# Patient Record
Sex: Female | Born: 1970 | Race: White | Hispanic: No | Marital: Married | Smoking: Current every day smoker
Health system: Southern US, Community
[De-identification: ages and names within clinical notes are randomized; demographics above are authoritative.]

## PROBLEM LIST (undated history)

## (undated) ENCOUNTER — Ambulatory Visit: Payer: PRIVATE HEALTH INSURANCE | Attending: Anesthesiology | Primary: Anesthesiology

## (undated) ENCOUNTER — Telehealth

## (undated) ENCOUNTER — Encounter

## (undated) ENCOUNTER — Ambulatory Visit

## (undated) ENCOUNTER — Encounter: Attending: Family Medicine | Primary: Family Medicine

## (undated) ENCOUNTER — Ambulatory Visit: Payer: PRIVATE HEALTH INSURANCE

## (undated) ENCOUNTER — Telehealth: Attending: Family | Primary: Family

## (undated) ENCOUNTER — Telehealth
Attending: Pharmacist Clinician (PhC)/ Clinical Pharmacy Specialist | Primary: Pharmacist Clinician (PhC)/ Clinical Pharmacy Specialist

## (undated) ENCOUNTER — Ambulatory Visit: Payer: Medicaid (Managed Care)

## (undated) ENCOUNTER — Encounter: Attending: Anesthesiology | Primary: Anesthesiology

## (undated) ENCOUNTER — Non-Acute Institutional Stay: Payer: PRIVATE HEALTH INSURANCE | Attending: Family Medicine | Primary: Family Medicine

## (undated) ENCOUNTER — Ambulatory Visit
Payer: Medicaid (Managed Care) | Attending: Student in an Organized Health Care Education/Training Program | Primary: Student in an Organized Health Care Education/Training Program

## (undated) ENCOUNTER — Encounter: Attending: Family | Primary: Family

## (undated) ENCOUNTER — Ambulatory Visit: Payer: PRIVATE HEALTH INSURANCE | Attending: Podiatrist | Primary: Podiatrist

## (undated) ENCOUNTER — Encounter: Payer: PRIVATE HEALTH INSURANCE | Attending: Family Medicine | Primary: Family Medicine

## (undated) ENCOUNTER — Encounter: Payer: PRIVATE HEALTH INSURANCE | Attending: Obstetrics & Gynecology | Primary: Obstetrics & Gynecology

## (undated) ENCOUNTER — Telehealth
Attending: Student in an Organized Health Care Education/Training Program | Primary: Student in an Organized Health Care Education/Training Program

## (undated) ENCOUNTER — Telehealth: Attending: Family Medicine | Primary: Family Medicine

## (undated) ENCOUNTER — Inpatient Hospital Stay

## (undated) ENCOUNTER — Telehealth: Attending: Internal Medicine | Primary: Internal Medicine

## (undated) ENCOUNTER — Encounter: Payer: PRIVATE HEALTH INSURANCE | Attending: Family | Primary: Family

## (undated) ENCOUNTER — Ambulatory Visit: Payer: PRIVATE HEALTH INSURANCE | Attending: Retina Specialist | Primary: Retina Specialist

## (undated) ENCOUNTER — Ambulatory Visit: Payer: Medicaid (Managed Care) | Attending: Orthopaedic Surgery | Primary: Orthopaedic Surgery

## (undated) ENCOUNTER — Telehealth: Attending: Clinical | Primary: Clinical

## (undated) ENCOUNTER — Encounter: Payer: PRIVATE HEALTH INSURANCE | Attending: Podiatrist | Primary: Podiatrist

## (undated) ENCOUNTER — Encounter: Attending: Internal Medicine | Primary: Internal Medicine

## (undated) ENCOUNTER — Encounter
Attending: Student in an Organized Health Care Education/Training Program | Primary: Student in an Organized Health Care Education/Training Program

## (undated) ENCOUNTER — Ambulatory Visit: Payer: Medicaid (Managed Care) | Attending: Family | Primary: Family

## (undated) ENCOUNTER — Encounter
Attending: Pharmacist Clinician (PhC)/ Clinical Pharmacy Specialist | Primary: Pharmacist Clinician (PhC)/ Clinical Pharmacy Specialist

## (undated) ENCOUNTER — Ambulatory Visit: Payer: PRIVATE HEALTH INSURANCE | Attending: Family Medicine | Primary: Family Medicine

## (undated) ENCOUNTER — Ambulatory Visit
Attending: Pharmacist Clinician (PhC)/ Clinical Pharmacy Specialist | Primary: Pharmacist Clinician (PhC)/ Clinical Pharmacy Specialist

## (undated) ENCOUNTER — Encounter: Attending: Diagnostic Radiology | Primary: Diagnostic Radiology

## (undated) DIAGNOSIS — E119 Type 2 diabetes mellitus without complications: Secondary | ICD-10-CM

## (undated) DIAGNOSIS — K219 Gastro-esophageal reflux disease without esophagitis: Secondary | ICD-10-CM

## (undated) DIAGNOSIS — K3184 Gastroparesis: Secondary | ICD-10-CM

## (undated) DIAGNOSIS — J449 Chronic obstructive pulmonary disease, unspecified: Secondary | ICD-10-CM

## (undated) DIAGNOSIS — I10 Essential (primary) hypertension: Secondary | ICD-10-CM

## (undated) DIAGNOSIS — B1920 Unspecified viral hepatitis C without hepatic coma: Secondary | ICD-10-CM

## (undated) DIAGNOSIS — K7460 Unspecified cirrhosis of liver: Secondary | ICD-10-CM

## (undated) DIAGNOSIS — D649 Anemia, unspecified: Secondary | ICD-10-CM

## (undated) HISTORY — PX: ABDOMINAL HYSTERECTOMY: SHX81

## (undated) HISTORY — PX: APPENDECTOMY: SHX54

## (undated) HISTORY — PX: KNEE REPAIR EXTENSOR MECHANISM: SHX6613

## (undated) HISTORY — PX: EYE SURGERY: SHX253

---
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
## 2016-10-19 DIAGNOSIS — B1920 Unspecified viral hepatitis C without hepatic coma: Secondary | ICD-10-CM

## 2016-10-19 DIAGNOSIS — J189 Pneumonia, unspecified organism: Secondary | ICD-10-CM

## 2016-10-19 DIAGNOSIS — D72829 Elevated white blood cell count, unspecified: Secondary | ICD-10-CM

## 2016-10-19 DIAGNOSIS — I10 Essential (primary) hypertension: Secondary | ICD-10-CM | POA: Diagnosis not present

## 2016-10-19 DIAGNOSIS — A419 Sepsis, unspecified organism: Secondary | ICD-10-CM

## 2016-10-20 DIAGNOSIS — I10 Essential (primary) hypertension: Secondary | ICD-10-CM | POA: Diagnosis not present

## 2016-10-20 DIAGNOSIS — J189 Pneumonia, unspecified organism: Secondary | ICD-10-CM | POA: Diagnosis not present

## 2016-10-20 DIAGNOSIS — D72829 Elevated white blood cell count, unspecified: Secondary | ICD-10-CM | POA: Diagnosis not present

## 2016-10-20 DIAGNOSIS — B1920 Unspecified viral hepatitis C without hepatic coma: Secondary | ICD-10-CM | POA: Diagnosis not present

## 2016-10-21 DIAGNOSIS — I10 Essential (primary) hypertension: Secondary | ICD-10-CM | POA: Diagnosis not present

## 2016-10-21 DIAGNOSIS — D72829 Elevated white blood cell count, unspecified: Secondary | ICD-10-CM | POA: Diagnosis not present

## 2016-10-21 DIAGNOSIS — J189 Pneumonia, unspecified organism: Secondary | ICD-10-CM | POA: Diagnosis not present

## 2016-10-21 DIAGNOSIS — B1920 Unspecified viral hepatitis C without hepatic coma: Secondary | ICD-10-CM | POA: Diagnosis not present

## 2017-04-20 ENCOUNTER — Ambulatory Visit
Admission: RE | Admit: 2017-04-20 | Discharge: 2017-04-20 | Disposition: A | Discharge status: Home / Self Care | Payer: Commercial Managed Care - PPO

## 2017-04-20 DIAGNOSIS — B182 Chronic viral hepatitis C: Secondary | ICD-10-CM

## 2017-04-20 DIAGNOSIS — J449 Chronic obstructive pulmonary disease, unspecified: Secondary | ICD-10-CM

## 2017-04-20 DIAGNOSIS — E118 Type 2 diabetes mellitus with unspecified complications: Principal | ICD-10-CM

## 2017-04-20 DIAGNOSIS — R609 Edema, unspecified: Secondary | ICD-10-CM

## 2017-04-20 DIAGNOSIS — Z1239 Encounter for other screening for malignant neoplasm of breast: Secondary | ICD-10-CM

## 2017-05-11 ENCOUNTER — Ambulatory Visit
Admission: RE | Admit: 2017-05-11 | Discharge: 2017-05-11 | Disposition: A | Discharge status: Home / Self Care | Payer: Commercial Managed Care - PPO

## 2017-05-11 DIAGNOSIS — Z1159 Encounter for screening for other viral diseases: Principal | ICD-10-CM

## 2017-06-12 ENCOUNTER — Ambulatory Visit
Admission: RE | Admit: 2017-06-12 | Discharge: 2017-06-12 | Disposition: A | Discharge status: Home / Self Care | Payer: Commercial Managed Care - PPO | Attending: Gastroenterology | Admitting: Gastroenterology

## 2017-06-12 DIAGNOSIS — B1920 Unspecified viral hepatitis C without hepatic coma: Principal | ICD-10-CM

## 2017-06-12 DIAGNOSIS — R1010 Upper abdominal pain, unspecified: Secondary | ICD-10-CM

## 2017-06-12 NOTE — Unmapped (Signed)
Counseling for HCV treatment     B18.2 Hep C: yes    K74.60 Cirrhosis: yes,   Child Pugh Score if applicable and for Medicaid pts: 5  Z94.4 Liver Transplant: yes    Genotype: 1a  (Ref 10/27/16 pg 6),  HCV RNA:  71,316 IU/ml on 05/11/17  Fibrosis score: F4 (60.1kPa) on Fibroscan on 06/12/17  HIV Co-infection? no  Signs of liver decompensation? no  Previous treatment? no    Planned regimen: Harvoni (ledipasvir/sofosbuvir 90/400mg ) x 12 weeks  Urgency: Routine Request    Prescribing Provider/NPI: Dr. Gavin Potters / [id number]  Signature waiver form not obtained at this time.  Insurance: [employer] Tyrica Afzal is 46 y.o. Caucasian female who presents to clinic with daughter, Karen Kitchens and is interested in starting treatment with Harvoni. We discussed the prior authorization (PA) process of obtaining the medication through insurance and that this may take some time.   We also discussed the importance of continued cirrhosis care and importance of ongoing surveillance for [facility].      Current medications:  Current Outpatient Prescriptions   Medication Sig Dispense Refill   ??? blood sugar diagnostic (GLUCOSE BLOOD) Strp Check blood sugar before breakfast and as needed. 100 each 11   ??? blood-glucose meter kit Use as instructed 1 each 0   ??? ibuprofen (ADVIL,MOTRIN) 600 MG tablet Take 1 tablet by mouth Two (2) times a day.     ??? lancets Misc Check blood sugar before breakfast and as needed. 100 each 11   ??? lisinopril (PRINIVIL,ZESTRIL) 40 MG tablet Take 1 tablet (40 mg total) by mouth daily. For blood pressure. 90 tablet 4   ??? metFORMIN (GLUCOPHAGE) 1000 MG tablet Take 1 tablet (1,000 mg total) by mouth 2 (two) times a day with meals. For diabetes. (Patient taking differently: Take 1,000 mg by mouth daily with breakfast. For diabetes.) 180 tablet 4   ??? ondansetron (ZOFRAN) 4 MG tablet TAKE 1 TABLET(4 MG) BY MOUTH DAILY AS NEEDED FOR NAUSEA 10 tablet 0   ??? tiotropium (SPIRIVA) 18 mcg inhalation capsule Place 1 capsule (18 mcg total) into inhaler and inhale daily. 30 capsule 11   OTC: ibuprofen prn     Following topics were discussed during counseling:    1. Indications for medication, dosage and administration.     A. Harvoni 90/400mg  1 tablet to take daily with or without food.    2. Common side effects of medications and management strategies. (fatigue, headache)    3. Importance of adherence to regimen, follow-up clinic visits and lab monitoring.     A. Asked patient to call [facility] [phone]  to establish start date for treatment and to schedule appointment 4 weeks before starting treatment.    4. Drug-drug interaction.    A. Current medications have been reviewed and assessed for possible interaction.     - Lovastatin: Currently not taking. Reports having adverse effects (i.e. Vomiting) with lovastatin thus discontinued. Recommended to discuss with PCP and to inform us if starting any new statin due to DDI with many statins.     -Denies any antacid/heartburn medication.  We discussed the mechanism of drug-drug interaction with acid lowering agents.     B. Advised to check with MD or pharmacist before taking any OTC/herbal medications, with emphasis regarding indigestion/heartburn medications.  Denies use of herbal medication such as milk thistle or St. John's wart.  Allergies have been verified. Denies alcohol. Recommended to stop marijuana.  5. Importance of informing pharmacy and clinic of updated contact information.   Discussed the process of obtaining medication through specialty pharmacy and when approved medication will be delivered to patient's home.     Patient verbalized understanding. Provided contact information for any questions/concerns.       Park Breed, Pharm D., BCPS, CGP, CPP  [facility]  [address]  [place], [place] [address]  [phone]    This portion of the visit was 20 minutes in duration and greater than 50% was spend in direct counseling and coordination of care regarding hepatitis C medication management.

## 2017-06-12 NOTE — Unmapped (Signed)
[facility]    Alba Destine, M.D.  Professor of Medicine  Director, [facility]  [place] at [place]    [phone]    Laurie Panda, MD  [address]  [address]  [facility]  [place], [place] [address]     Chief complaint: Patient is referred for consultation for chronic hepatitis C genotype 1A    Present illness: Patient is a 46 y.o. Caucasian female with chronic hepatitis C genotype 1A. The patient was first diagnosed around February 2018 when she was hospitalized for urosepsis. Patient states that she may have had acute hepatitis in 2017 and [facility]. She does not recall being told what kind of hepatitis that was and I do not have results available from that hospitalization this time. Since being discharged from the hospital she notes fatigue and abdominal bloating. She has constant upper abdominal pain. She also has some nausea and vomiting. She notes shortness of breath with a recent diagnosis of COPD. She eats small portions of food to minimize abdominal pain and nausea.  No Gi bleeding.     10 system review of systems is as noted above.    Past medical history:  1. COPD  2. Diabetic neuropathy  3. Chronic back pain  4. Hypertension  5. No history of coronary artery disease  6. Immune to HAV, NEEDS HBV VACCINE NEXT VISIT  7. FIBROSCAN 05/2017:60 kPa consistent with stage F4 cirrhosis    Allergies   Allergen Reactions   ??? Amitriptyline Other (See Comments)     Hallucinations    ??? Tylenol [Acetaminophen] Itching       Current Outpatient Prescriptions   Medication Sig Dispense Refill   ??? blood sugar diagnostic (GLUCOSE BLOOD) Strp Check blood sugar before breakfast and as needed. 100 each 11   ??? blood-glucose meter kit Use as instructed 1 each 0   ??? ibuprofen (ADVIL,MOTRIN) 600 MG tablet Take 1 tablet by mouth Two (2) times a day.     ??? lancets Misc Check blood sugar before breakfast and as needed. 100 each 11   ??? lisinopril (PRINIVIL,ZESTRIL) 40 MG tablet Take 1 tablet (40 mg total) by mouth daily. For blood pressure. 90 tablet 4   ??? metFORMIN (GLUCOPHAGE) 1000 MG tablet Take 1 tablet (1,000 mg total) by mouth 2 (two) times a day with meals. For diabetes. (Patient taking differently: Take 1,000 mg by mouth daily with breakfast. For diabetes.) 180 tablet 4   ??? ondansetron (ZOFRAN) 4 MG tablet TAKE 1 TABLET(4 MG) BY MOUTH DAILY AS NEEDED FOR NAUSEA 10 tablet 0   ??? tiotropium (SPIRIVA) 18 mcg inhalation capsule Place 1 capsule (18 mcg total) into inhaler and inhale daily. 30 capsule 11   ??? lovastatin (MEVACOR) 20 MG tablet TAKE 1 TABLET BY MOUTH EVERY DAY WITH EVENING MEAL (Patient not taking: Reported on 06/12/2017) 90 tablet 0   ??? traMADol (ULTRAM) 50 mg tablet Take 1 tablet by mouth daily as needed.       No current facility-administered medications for this visit.      Social history: The patient is not working. She is married. She has 3 children. Her daughter has been tested for hepatitis C and is negative. She does not drink alcohol. She smokes 1 pack of cigarettes per day.    Family history: Mother has alcoholic cirrhosis.    BP 129/88  - Pulse 80  -  Temp 36.9 ??C (98.4 ??F)  - Resp 20  - Ht 165.1 cm (5' 5)  - Wt 84.8 kg (187 lb)  - SpO2 99%  - BMI 31.12 kg/m??     Pleasant individual in NAD    HEENT: Sclera are anicteric, no temporal muscle loss, oropharynx is negative  NECK: No thyromegaly or lymphadenopathy, No carotid bruits  Chest: Clear to auscultation and percussion  Heart: S1, S2, RR, No murmurs  Abdomen: Soft, non-tender, non-distended, +hepatomegaly, no splenomegaly, no masses appreciated, no ascites  Skin: +spider angiomata, No rashes  Extremities: + 1 pedal edema, +palmar erythema  Neuro: Grossly intact, No focal deficits      Results for orders placed or performed in visit on 06/12/17   Comprehensive Metabolic Panel   Result Value Ref Range    Sodium 142 135 - 145 mmol/L    Potassium 3.9 3.5 - 5.0 mmol/L Chloride 105 98 - 107 mmol/L    CO2 27.0 22.0 - 30.0 mmol/L    BUN 7 7 - 21 mg/dL    Creatinine 1.30 (L) 0.60 - 1.00 mg/dL    BUN/Creatinine Ratio 13     EGFR MDRD Non Af Amer >=60 >=60 mL/min/1.15m2    EGFR MDRD Af Amer >=60 >=60 mL/min/1.82m2    Anion Gap 10 9 - 15 mmol/L    Glucose 73 65 - 179 mg/dL    Calcium 8.7 8.5 - 86.5 mg/dL    Albumin 3.6 3.5 - 5.0 g/dL    Total Protein 8.6 (H) 6.5 - 8.3 g/dL    Total Bilirubin 1.3 (H) 0.0 - 1.2 mg/dL    AST 784 (H) 14 - 38 U/L    ALT 93 (H) 15 - 48 U/L    Alkaline Phosphatase 94 38 - 126 U/L   PT-INR   Result Value Ref Range    PT 14.0 (H) 10.2 - 12.8 sec    INR 1.23    Iron Level and TIBC   Result Value Ref Range    Iron 181 (H) 35 - 165 ug/dL    TIBC 696.2 952.8 - 413.2 mg/dL    Transferrin 440.1 027.2 - 380.0 mg/dL    Iron Saturation (%) 61 (H) 15 - 50 %   Ferritin   Result Value Ref Range    Ferritin 233.0 (H) 3.0 - 151.0 ng/mL   AFP tumor marker   Result Value Ref Range    AFP-Tumor Marker 27.70 (H) <7.51 ng/mL   Hepatitis B Surface Antigen   Result Value Ref Range    Hepatitis B Surface Ag Nonreactive Nonreactive   Hepatitis B Core Antibody, total   Result Value Ref Range    Hep B Core Total Ab Nonreactive Nonreactive   Hepatitis B Surface Antibody   Result Value Ref Range    Hep B S Ab Nonreactive Nonreactive, Grayzone    Hepatitis B Surface Ab Quant <8.00 <8.00 m(IU)/mL   Hepatitis A IgG   Result Value Ref Range    Hepatitis A IgG Reactive (A) Nonreactive   CBC w/ Differential   Result Value Ref Range    WBC 4.9 4.5 - 11.0 10*9/L    RBC 3.81 (L) 4.00 - 5.20 10*12/L    HGB 12.9 12.0 - 16.0 g/dL    HCT 53.6 64.4 - 03.4 %    MCV 105.2 (H) 80.0 - 100.0 fL    MCH 33.8 26.0 - 34.0 pg    MCHC 32.1 31.0 - 37.0 g/dL    RDW 74.2 (  H) 12.0 - 15.0 %    MPV 11.4 (H) 7.0 - 10.0 fL    Platelet 105 (L) 150 - 440 10*9/L    Absolute Neutrophils 2.6 2.0 - 7.5 10*9/L    Absolute Lymphocytes 1.7 1.5 - 5.0 10*9/L    Absolute Monocytes 0.3 0.2 - 0.8 10*9/L    Absolute Eosinophils 0.2 0.0 - 0.4 10*9/L    Absolute Basophils 0.0 0.0 - 0.1 10*9/L    Large Unstained Cells 2 0 - 4 %    Macrocytosis Marked (A) Not Present    Anisocytosis Slight (A) Not Present   Morphology Review   Result Value Ref Range    Smear Review Comments See Comment (A) Undefined     MELD-Na score: 9 at 06/12/2017 11:07 AM  MELD score: 9 at 06/12/2017 11:07 AM  Calculated from:  Serum Creatinine: 0.53 mg/dL (Rounded to 1) at 1/61/0960 11:07 AM  Serum Sodium: 142 mmol/L (Rounded to 137) at 06/12/2017 11:07 AM  Total Bilirubin: 1.3 mg/dL at 4/54/0981 19:14 AM  INR(ratio): 1.23 at 06/12/2017 11:07 AM  Age: 30 years      Impression:  1. Chronic hepatitis C genotype 1A with evidence of well compensated cirrhosis.  This patient appears to have well compensated cirrhosis based upon the data available to [place] thus far. We discussed the naturally history of cirrhosis including increased risk of liver failure, hepatic decompensation, liver cancer and need for liver transplantation. At this point, patient has a relatively low MELD and is not in need of liver transplant evaluation. We discussed that HCV can progress and ther fore continued monitoring is important. Patient should have EGD surveillance for esophageal varices if not performed recently. We also discussed the importance of hepatic imaging performed approximately every 6 months to screen for the development of HCC.     The patient will meet with our pharmacist Erskine Squibb to review treatment options. She will be good candidate for treatment that has high rate of cure and low risk of side effects.    2. HCC surveillance: MRI and MRCP was ordered for further evaluation    3. Evidence of portal hypertension: Upper endoscopy was ordered to screen for esophageal varices    4. Chronic abdominal pain: We'll further evaluate with upper endoscopy and MRI MRCP as noted above.    5. Lower extremity edema: The patient does not have clinical ascites. I recommended a low-salt diet and his initial management for her lower extremity edema. If she remains adherent to the diet and continues to have lower extremity edema than would start low-dose spironolactone and furosemide.    6. We discussed the low risk of vertical transmission to her children: Her children should be tested for hepatitis C on 1 occasion. I reassured her about the absence of risk at this time.    The patient return for follow-up in 4 weeks to see Owens Shark DNP.    Alba Destine, M.D.  Professor of Medicine  Director, [facility]  [place] [facility] [place] at [place]    [phone]

## 2017-06-12 NOTE — Unmapped (Addendum)
[facility]  FAST ??? Fibrosis Assessment Team  Division of Gastroenterology and Hepatology  ??  ??    ??  FIBROSCAN will be performed to assess hepatic fibrosis (scarring) in order to stage this patient's liver disease. This will assist with evaluating the natural course of the disease and will provide important information regarding prognosis, duration of therapy, and potential response to treatment. This information will also help assess risk for hepatocellular carcinoma and need for liver cancer surveillance.??  ??  FibroscanProcedure:   After obtaining verbal consent, the patient was placed in a supine position. Physical characteristics and landmarks were assessed to establish appropriate mid-axillary intercostal space for probe placement. 50Hz  Shear Wave pulses were applied and the resulting Shear Wave and Propagation Speed detected with a 3.5 MHz ultrasonic signal, using the FibroScan probe.  Skin to liver capsule distance and liver parenchyma were accessed during the entire examination with the FibroScan probe. The patient was instructed to breathe normally and to abstain from sudden movements during the procedure, allowing for random measurements of liver stiffness. At least ten Shear Waves were produced; individual measurements of each Shear Wave were calculated. Patient tolerated the procedure well and was discharged without incident.  ??  Probe used []  M+    Serial # [id number]                         [x]  XL+   Serial # [id number]  ??  Main Etiology of Liver Disease:  [x] HCV     [] HBV   [] Alcohol    [] NASH  [] PBC      [] PSC     [] Other________________  ??  50Hz  shear wave pulses were applied and the resulting shear wave and propagation speed detected with a 3.5 MHz ultrasonic signal, using the FibroScan probe.     ??  At least ten Shear Waves were produced; individual measurements of each shear wave were calculated.    ??  Patient tolerated the procedure well and was discharged without incident.  ??  Fibroscan score: ___60.1___kPa  ??  IQR:                       ___22___%  ??  Test performed by: Luiz Ochoa, RN  ??  [facility]  FAST ??? Fibrosis Assessment Team  Division of Gastroenterology and Hepatology  ??  ??    ??  ??  ??  Estimation of the stage of liver fibrosis (Metavir Score):  The results of the Liver Stiffness Score are consistent with the following liver fibrosis stage:  ??  ??  []  F0-F1             []  F2               []  F3               []  F4  ??  ??  GENERAL RECOMMENDATIONS ACCORDING TO THE STAGE OF LIVER FIBROSIS.  ??  F0-F1: No-minimal fibrosis. The risk of progression to advanced fibrosis and cirrhosis is low. If the cause of liver disease is not removed, a 1-2 yr follow-up study is recommended.   F2: Significant fibrosis. There is a moderate risk of progression to cirrhosis. If the cause of liver disease is not removed, a follow-up study in 12 months is recommended.  F3: Advanced (pre-cirrhotic stage). The risk of progression to cirrhosis is high. Imaging studies to rule out hepatocellular carcinoma  should be considered. Efforts to remove the cause of liver disease are highly recommended.  F4: Cirrhosis. There is significant risk of portal hypertension and esophageal varices. An upper endoscopy is recommended. Imaging studies for hepatocellular carcinoma screening are recommended.   ??  Any and all FibroScan studies must be carefully evaluated, taking fully into account all individual measurement/scans, patient history and other factors.  As with liver biopsy, any estimation of liver fibrosis may be subject to under or over staging due to sampling error.  Any further medical or surgical intervention should be made only while fully considering the circumstances of this patient and in consultation with this patient.    I have reviewed and interpreted the FIBROSCAN test results as described above.  ??  Alba Destine, M.D.  Professor of Medicine  Director, [facility]  [place] at [place] [phone]

## 2017-06-13 LAB — HEPATITIS B SURFACE ANTIBODY QUANT: Hepatitis B virus surface Ab:ACnc:Pt:Ser:Qn:: <8

## 2017-06-13 LAB — HEPATITIS A IGG: Hepatitis A virus Ab.IgG:PrThr:Pt:Ser:Ord:: REACTIVE — AB

## 2017-06-13 LAB — HEPATITIS B CORE TOTAL ANTIBODY: Hepatitis B virus core Ab:PrThr:Pt:Ser/Plas:Ord:IA: NONREACTIVE

## 2017-06-13 LAB — HEPATITIS B SURFACE ANTIGEN: Hepatitis B virus surface Ag:PrThr:Pt:Ser:Ord:: NONREACTIVE

## 2017-06-13 LAB — HEPATITIS B SURFACE ANTIBODY: HEPATITIS B SURFACE ANTIBODY: NONREACTIVE

## 2017-06-19 ENCOUNTER — Ambulatory Visit
Admission: RE | Admit: 2017-06-19 | Discharge: 2017-06-19 | Disposition: A | Discharge status: Home / Self Care | Payer: Commercial Managed Care - PPO

## 2017-06-19 DIAGNOSIS — R1010 Upper abdominal pain, unspecified: Principal | ICD-10-CM

## 2017-06-26 NOTE — Unmapped (Signed)
I have reviewed and interpreted the FIBROSCAN test results as described above.    Alba Destine, M.D.  Professor of Medicine  Director, [facility]  [place] at [facility]

## 2017-06-27 NOTE — Unmapped (Signed)
Spoke with patient. Patient scheduled appt for 06/28/17 at 11:20 to discuss letter. Patient verbalized understanding.

## 2017-06-27 NOTE — Unmapped (Signed)
-----   Message from Danton Clap, MD sent at 06/27/2017 11:03 AM EDT -----  Would need to have OV to discuss  ----- Message -----  From: Gifford Shave, CMA  Sent: 06/26/2017   2:33 PM  To: Danton Clap, MD    Please advise.  ----- Message -----  From: Selinda Eon  Sent: 06/26/2017   1:52 PM  To: Gifford Shave, CMA    Patient requesting a letter for Social Services stating that heating and cooling are needed for health factors which would be her COPD

## 2017-06-28 ENCOUNTER — Ambulatory Visit: Admission: RE | Admit: 2017-06-28 | Discharge: 2017-06-28 | Payer: Commercial Managed Care - PPO

## 2017-06-28 DIAGNOSIS — L309 Dermatitis, unspecified: Principal | ICD-10-CM

## 2017-06-28 MED ORDER — CLOTRIMAZOLE 1 % TOPICAL CREAM
Freq: Two times a day (BID) | TOPICAL | 0 refills | 0 days | Status: CP
Start: 2017-06-28 — End: 2017-10-23

## 2017-06-28 MED ORDER — CETIRIZINE 10 MG TABLET
ORAL_TABLET | Freq: Every day | ORAL | 1 refills | 0 days | Status: CP
Start: 2017-06-28 — End: 2017-10-23

## 2017-06-28 NOTE — Unmapped (Signed)
Assessment and Plan:     Diagnoses and all orders for this visit:    Dermatitis  Comments:  referral to dermatology for further eval/mgt; lotrimin cream prescribed  Orders:  -     Ambulatory referral to Dermatology; Future    Other orders  -     clotrimazole (LOTRIMIN) 1 % cream; Apply topically Two (2) times a day.  -     cetirizine (ZYRTEC) 10 MG tablet; Take 1 tablet (10 mg total) by mouth daily.        Pertinent handouts were given today and reviewed with the patient as indicated.  The Care Plan have been reviewed with patient who verbalizes understanding.  Any outside resources or referrals needed at this time are noted above. Patient's current medications have been reviewed. Any new medications prescribed have been discussed, and side effects have been addressed.  Have assessed the patient's understanding, respsonse, and barriers to adherence to medications.Patient voiced understanding and all questions have been answered to satisfaction.     Subjective:     HPI: Carol Arias is a 46 y.o. female here for complaint of rash. Denies known contacts or exposures but there is a new kitten in the home.              I have reviewed past medical, surgical, medication, allergy, social and family histories today and updated them in Epic where appropriate.    Allergies:     Amitriptyline and Tylenol [acetaminophen]    Current Medications:     Current Outpatient Prescriptions   Medication Sig Dispense Refill   ??? blood sugar diagnostic (GLUCOSE BLOOD) Strp Check blood sugar before breakfast and as needed. 100 each 11   ??? blood-glucose meter kit Use as instructed 1 each 0   ??? ibuprofen (ADVIL,MOTRIN) 600 MG tablet Take 1 tablet by mouth Two (2) times a day.     ??? lancets Misc Check blood sugar before breakfast and as needed. 100 each 11   ??? lisinopril (PRINIVIL,ZESTRIL) 40 MG tablet Take 1 tablet (40 mg total) by mouth daily. For blood pressure. 90 tablet 4   ??? metFORMIN (GLUCOPHAGE) 1000 MG tablet Take 1 tablet (1,000 mg total) by mouth 2 (two) times a day with meals. For diabetes. (Patient taking differently: Take 1,000 mg by mouth daily with breakfast. For diabetes.) 180 tablet 4   ??? ondansetron (ZOFRAN) 4 MG tablet TAKE 1 TABLET(4 MG) BY MOUTH DAILY AS NEEDED FOR NAUSEA 10 tablet 0   ??? tiotropium (SPIRIVA) 18 mcg inhalation capsule Place 1 capsule (18 mcg total) into inhaler and inhale daily. 30 capsule 11   ??? traMADol (ULTRAM) 50 mg tablet Take 1 tablet by mouth daily as needed.     ??? lovastatin (MEVACOR) 20 MG tablet TAKE 1 TABLET BY MOUTH EVERY DAY WITH EVENING MEAL (Patient not taking: Reported on 06/28/2017) 90 tablet 0     No current facility-administered medications for this visit.        Health Maintenance:     Health Maintenance Due   Topic Date Due   ??? Retinal Eye Exam  05/20/1980   ??? Influenza Vaccine (1) 05/19/2017       Immunizations:     Immunization History   Administered Date(s) Administered   ??? Pneumococcal Polysaccharide 23 11/16/2016       ROS:     Review of Systems   Skin: Positive for rash.   All other systems reviewed and are negative.  Objective:     Vitals:    06/28/17 1142   BP: 136/82   Pulse: 92   Resp: 16   Temp: 37.2 ??C (98.9 ??F)   Weight: 79.9 kg (176 lb 3.2 oz)     Body mass index is 29.32 kg/m??.  Wt Readings from Last 3 Encounters:   06/28/17 79.9 kg (176 lb 3.2 oz)   06/12/17 84.8 kg (187 lb)   04/20/17 80 kg (176 lb 6.4 oz)       Physical Exam   Constitutional: She is oriented to person, place, and time. She appears well-developed and well-nourished. No distress.   HENT:   Head: Normocephalic and atraumatic.   Neck: Normal range of motion. Neck supple.   Cardiovascular: Normal rate, regular rhythm and normal heart sounds.    No murmur heard.  Pulmonary/Chest: Effort normal and breath sounds normal. No respiratory distress.   Abdominal: Soft. She exhibits no distension. There is no tenderness.   Musculoskeletal: Normal range of motion. She exhibits no edema.   Neurological: She is alert and oriented to person, place, and time.   Skin: Skin is warm and dry. Rash noted.   Psychiatric: She has a normal mood and affect. Her behavior is normal. Judgment and thought content normal.   Nursing note and vitals reviewed.      POC Testing and Recent Labs:     No results found for this visit on 06/28/17.

## 2017-07-09 MED ORDER — LEDIPASVIR 90 MG-SOFOSBUVIR 400 MG TABLET
ORAL_TABLET | Freq: Every day | ORAL | 2 refills | 0.00000 days | Status: CP
Start: 2017-07-09 — End: 2017-10-23

## 2017-07-10 ENCOUNTER — Ambulatory Visit
Admission: RE | Admit: 2017-07-10 | Discharge: 2017-07-10 | Disposition: A | Discharge status: Home / Self Care | Payer: Commercial Managed Care - PPO

## 2017-07-10 ENCOUNTER — Ambulatory Visit
Admission: RE | Admit: 2017-07-10 | Discharge: 2017-07-10 | Disposition: A | Discharge status: Home / Self Care | Payer: Commercial Managed Care - PPO | Attending: Family | Admitting: Family

## 2017-07-10 DIAGNOSIS — K7460 Unspecified cirrhosis of liver: Secondary | ICD-10-CM

## 2017-07-10 DIAGNOSIS — Z23 Encounter for immunization: Secondary | ICD-10-CM

## 2017-07-10 DIAGNOSIS — B1920 Unspecified viral hepatitis C without hepatic coma: Principal | ICD-10-CM

## 2017-07-10 DIAGNOSIS — B182 Chronic viral hepatitis C: Principal | ICD-10-CM

## 2017-07-10 NOTE — Unmapped (Signed)
1. Flu vaccine administered today.   2. First hepatitis B vaccine administered today.   3. Office follow up ten weeks with hope of you being seen back in clinic with your hepatitis C treatment having been already started.   4. Strict control of your diabetes.  Healthy diet, regular exercise and weight management.    5.  Low sodium diet, no more than 2,000 mg total of salt intake daily. Need to avoid [facility].   6.  Any questions please notify our office.   7.  Remain scheduled for upper endoscopy next week.   8.  Will be in touch with EGD result as well as echocardiogram result.      Heart-Healthy Diet: Care Instructions  Your Care Instructions    A heart-healthy diet has lots of vegetables, fruits, nuts, beans, and whole grains, and is low in salt. It limits foods that are high in saturated fat, such as meats, cheeses, and fried foods. It may be hard to change your diet, but even small changes can lower your risk of heart attack and heart disease.  Follow-up care is a key part of your treatment and safety. Be sure to make and go to all appointments, and call your doctor if you are having problems. It's also a good idea to know your test results and keep a list of the medicines you take.  How can you care for yourself at home?  Watch your portions  ?? Learn what a serving is. A serving and a portion are not always the same thing. Make sure that you are not eating larger portions than are recommended. For example, a serving of pasta is ?? cup. A serving size of meat is 2 to 3 ounces. A 3-ounce serving is about the size of a deck of cards. Measure serving sizes until you are good at eyeballing them. Keep in mind that restaurants often serve portions that are 2 or 3 times the size of one serving.  ?? To keep your energy level up and keep you from feeling hungry, eat often but in smaller portions.  ?? Eat only the number of calories you need to stay at a healthy weight. If you need to lose weight, eat fewer calories than your body burns (through exercise and other physical activity).  Eat more fruits and vegetables  ?? Eat a variety of fruit and vegetables every day. Dark green, deep orange, red, or yellow fruits and vegetables are especially good for you. Examples include spinach, carrots, peaches, and berries.  ?? Keep carrots, celery, and other veggies handy for snacks. Buy fruit that is in season and store it where you can see it so that you will be tempted to eat it.  ?? Cook dishes that have a lot of veggies in them, such as stir-fries and soups.  Limit saturated and trans fat  ?? Read food labels, and try to avoid saturated and trans fats. They increase your risk of heart disease. Trans fat is found in many processed foods such as cookies and crackers.  ?? Use olive or canola oil when you cook. Try cholesterol-lowering spreads, such as Benecol or Take Control.  ?? Bake, broil, grill, or steam foods instead of frying them.  ?? Choose lean meats instead of high-fat meats such as hot dogs and sausages. Cut off all visible fat when you prepare meat.  ?? Eat fish, skinless poultry, and meat alternatives such as soy products instead of high-fat meats. Soy products, such as  tofu, may be especially good for your heart.  ?? Choose low-fat or fat-free milk and dairy products.  Eat fish  ?? Eat at least two servings of fish a week. Certain fish, such as salmon and tuna, contain omega-3 fatty acids, which may help reduce your risk of heart attack.  Eat foods high in fiber  ?? Eat a variety of grain products every day. Include whole-grain foods that have lots of fiber and nutrients. Examples of whole-grain foods include oats, whole wheat bread, and brown rice.  ?? Buy whole-grain breads and cereals, instead of white bread or pastries.  Limit salt and sodium  ?? Limit how much salt and sodium you eat to help lower your blood pressure.  ?? Taste food before you salt it. Add only a little salt when you think you need it. With time, your taste buds will adjust to less salt.  ?? Eat fewer snack items, fast foods, and other high-salt, processed foods. Check food labels for the amount of sodium in packaged foods.  ?? Choose low-sodium versions of canned goods (such as soups, vegetables, and beans).  Limit sugar  ?? Limit drinks and foods with added sugar. These include candy, desserts, and soda pop.  Limit alcohol  ?? Limit alcohol to no more than 2 drinks a day for men and 1 drink a day for women. Too much alcohol can cause health problems.  When should you call for help?  Watch closely for changes in your health, and be sure to contact your doctor if:  ?? ?? You would like help planning heart-healthy meals.   Where can you learn more?  Go to [facility] at https://carlson-fletcher.info/.  Select Preferences in the upper right hand corner, then select Health Library under Resources. Enter V137 in the search box to learn more about Heart-Healthy Diet: Care Instructions.  Current as of: August 23, 2016  Content Version: 11.8  ?? 2006-2018 [employer]. Care instructions adapted under license by [facility]. If you have questions about a medical condition or this instruction, always ask your healthcare professional. Healthwise, Inc[employer] disclaims any warranty or liability for your use of this information.

## 2017-07-10 NOTE — Unmapped (Signed)
[facility]    Alba Destine, M.D.  Professor of Medicine  Director, [facility]  [place] [facility] [place] [facility] [place]    [phone]    Danton Clap, MD  [address]  [address]  [place]  [place], [place] [address]     Chief complaint: Reevaluation for HCV treatment.  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 Chronic hepatitis C, treatment naive, genotype 1A.    Present illness: Patient is a 46 y.o. Caucasian female with chronic hepatitis C genotype 1A. She was initially seen in consultation by Dr. Sharon Mt on 06/12/2017. 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 No history of alcohol abuse. She was instructed to proceed with MRI of abdomen for [facility] screening, which she has underwent. See Liver Care Section for details.  Pending EGD to be performed 07/17/2017 for variceal screening. Persistent complaint of BLE of which she was scheduled for echocardiogram (bubble study) today.      She presents alone today.  She was instructed last visit to adhere to low sodium diet, less than 2,000 mg total daily. She has not been doing this. Consuming excessive amount of soda daily. Appetite has been fair to good. She has been successful with weight loss since last visit. Her DM has been better controlled and her PCP is thinking about decreasing Metformin dose down to total 1,000 mg daily from 2,000 mg daily. BM pattern does vary between constipation and diarrhea a time. Denies BRBPR. Dysphagia at times with liquids or solids.  + mild nausea.     The patient was first diagnosed with HCV around February 2018 when she was hospitalized for urosepsis. Patient states that she may have had acute hepatitis in 2017. She does not recall being told what kind of hepatitis at the time. Since being discharged from the hospital she has noted fatigue and abdominal bloating. She was experiencing constant upper abdominal pain. This has improved some with weight loss. She notes shortness of breath with a recent diagnosis of COPD. She eats small portions of food to minimize abdominal pain and nausea. History of colonoscopy over ten years ago with colonic polyps retrieved. No surveillance since.     ROS: All systems reviewed and negative except in HPI.    Past medical history:  1. COPD  2. Diabetic neuropathy  3. Chronic back pain  4. Hypertension  5. No history of coronary artery disease    Liver Section:  1. Immune to HAV  2. Immunity hepatitis B: #1 vaccine 07/10/2017   3. Chronic hepatitis C, treatment naive with genotype 1A  Baseline HCV RNA:  71,316 IU/ml on 05/11/17  4. FIBROSCAN 05/2017:60 kPa consistent with stage F4 cirrhosis  5.  HCC screening: MRI of abdomen 06/19/2017 - Hepatic cirrhosis and minimal perihepatic ascites. No evidence of hepatic neoplasm. Minimal perihepatic ascites. Borderline splenomegaly  6. Variceal screening: EGD ordered August 17, 2017  7. Echocardiogram: 07/10/2017 -  ?? Technically difficult study due to chest wall/lung interference  ?? Normal left ventricular systolic function, ejection fraction 55 to 60%  ?? Dilated left atrium - mild  ?? Normal right ventricular systolic function  ?? Intrapulmonary shunt (small - Grade I)    8. Osteoporosis screening: Address next visit proceeding with bone density study. Need to order Vitamin D level.   9.  Colon cancer screening: Warrants surveillance 2019. Personal history of colonic polyps.   10. Iron  studies and ferritin:   Ref. Range 06/12/2017 11:07   Iron Latest Ref Range: 35 - 165 ug/dL 161 (H)   Transferrin Latest Ref Range: 200.0 - 380.0 mg/dL 096.0   TIBC Latest Ref Range: 252.0 - 479.0 mg/dL 454.0   Iron Saturation (%) Latest Ref Range: 15 - 50 % 61 (H)   Ferritin Latest Ref Range: 3.0 - 151.0 ng/mL 233.0 (H)       Allergies   Allergen Reactions   ??? Amitriptyline Other (See Comments)     Hallucinations    ??? Tylenol [Acetaminophen] Itching       Current Outpatient Prescriptions   Medication Sig Dispense Refill   ??? blood sugar diagnostic (GLUCOSE BLOOD) Strp Check blood sugar before breakfast and as needed. 100 each 11   ??? blood-glucose meter kit Use as instructed 1 each 0   ??? clotrimazole (LOTRIMIN) 1 % cream Apply topically Two (2) times a day. 45 g 0   ??? lancets Misc Check blood sugar before breakfast and as needed. 100 each 11   ??? lisinopril (PRINIVIL,ZESTRIL) 40 MG tablet Take 1 tablet (40 mg total) by mouth daily. For blood pressure. 90 tablet 4   ??? metFORMIN (GLUCOPHAGE) 1000 MG tablet Take 1 tablet (1,000 mg total) by mouth 2 (two) times a day with meals. For diabetes. (Patient taking differently: Take 1,000 mg by mouth daily with breakfast. For diabetes.) 180 tablet 4   ??? tiotropium (SPIRIVA) 18 mcg inhalation capsule Place 1 capsule (18 mcg total) into inhaler and inhale daily. 30 capsule 11   ??? cetirizine (ZYRTEC) 10 MG tablet Take 1 tablet (10 mg total) by mouth daily. (Patient not taking: Reported on 07/10/2017) 30 tablet 1   ??? ibuprofen (ADVIL,MOTRIN) 600 MG tablet Take 1 tablet by mouth Two (2) times a day.     ??? ledipasvir 90 mg-sofosbuvir 400 mg (HARVONI) tablet Take 1 tablet by mouth daily. (Patient not taking: Reported on 07/10/2017) 28 tablet 2   ??? lovastatin (MEVACOR) 20 MG tablet TAKE 1 TABLET BY MOUTH EVERY DAY WITH EVENING MEAL (Patient not taking: Reported on 06/28/2017) 90 tablet 0   ??? ondansetron (ZOFRAN) 4 MG tablet TAKE 1 TABLET(4 MG) BY MOUTH DAILY AS NEEDED FOR NAUSEA (Patient not taking: Reported on 07/10/2017) 10 tablet 0   ??? traMADol (ULTRAM) 50 mg tablet Take 1 tablet by mouth daily as needed.       No current facility-administered medications for this visit.      Social history: The patient is not working. She is married. She has 3 children. Her daughter has been tested for hepatitis C and is negative. She is unaware of middle daughter has been screened and she knows her son has not been screened.   She does not drink alcohol. She smokes 1 pack of cigarettes per day.    Family history: Mother has alcoholic cirrhosis.    Physical Examination:     BP 116/78  - Pulse 94  - Temp 36.8 ??C (98.2 ??F) (Oral)  - Resp 14  - Ht 165.1 cm (5' 5)  - Wt 80.8 kg (178 lb 1.6 oz)  - SpO2 99%  - BMI 29.64 kg/m??   General: Pleasant individual in NAD. WD, overweight.   HEENT: Sclera are anicteric, no temporal muscle loss, oropharynx is negative  NECK: No thyromegaly or lymphadenopathy, No carotid bruits  Chest: Clear to auscultation and percussion  Heart: S1, S2, RR, No murmurs  GI: Abdomen soft, non-tender, non-distended, +hepatomegaly, no splenomegaly,  no masses appreciated, no ascites  Skin: +spider angiomata, No rashes  Extremities: + 1 pedal edema, +palmar erythema  Neuro: Grossly intact, No focal deficits    Laboratory Studies:   All prior laboratory and diagnostic imaging were personally reviewed by myself.     MELD-Na score: 9 at 06/12/2017 11:07 AM  MELD score: 9 at 06/12/2017 11:07 AM  Calculated from:  Serum Creatinine: 0.53 mg/dL (Rounded to 1) at 1/61/0960 11:07 AM  Serum Sodium: 142 mmol/L (Rounded to 137) at 06/12/2017 11:07 AM  Total Bilirubin: 1.3 mg/dL at 4/54/0981 19:14 AM  INR(ratio): 1.23 at 06/12/2017 11:07 AM  Age: 43 years      Impression/Plan:  1. Chronic hepatitis C genotype 1A with evidence of well compensated cirrhosis.    Mrs. Hirt is a 46 yo Caucasian female who presents today for follow up. She was seen in consultation by Dr. Sharon Mt on 06/12/2017. She was instructed today to follow up to review recent imaging results as well as endoscopy findings prior to proceeding with PA for HCV treatment. MRI of abdomen findings were reviewed with patient. Unfortunately, she is not scheduled for endoscopic evaluation until next week. Awaiting findings prior to proceeding with her HCV treatment given her GI symptoms.     No additional laboratory studies were warranted today.     2. HCC surveillance: MRI and MRCP has been performed. She will warrant HCC surveillance every six months at this time.     3. Evidence of portal hypertension: Upper endoscopy was ordered to screen for esophageal varices. Pending results. Scheduled August 17, 2017.     4. Chronic abdominal pain: Symptoms have improved. Pending upper endoscopy evaluation. She will warrant colonoscopy in near future given personal history of colonic polyps.     5. Lower extremity edema: Stressed to patient again the extreme importance of adhering to low sodium diet. She has continued to consume excessive amount of salt on daily basis. Feel probably rationale why she is still struggling some with BLE.  Reviewed salt content online with patient surrounding soda and other foods she consumes. She will try to adhere better between now and next clinic visit in ten weeks.  If she becomes adherent to the diet and continues to have lower extremity edema than would start low-dose spironolactone and furosemide.    6. Family Counseling: We discussed again the low risk of vertical transmission to her children: Stressed all her children need to be tested for hepatitis C once.    7. HCV treatment: once endoscopy and echocardiogram results have been reviewed will speak with our pharmacist, Park Breed, about proceeding with PA for HCV treatment. Goal at this point is for her to follow up in ten weeks with hopefully already having been started on HCV treatment.     8.  Osteoporosis screening: Address next visit ~ Bone density study and Vitamin D level.     All patient's questions were answered to her satisfaction during visit today. 50 percent of visit send counseling on cirrhosis care.     Rodman Key, DNP, FNP-BC  [facility]  [address]  [place] [address]  Phone [phone]

## 2017-07-17 ENCOUNTER — Ambulatory Visit
Admission: RE | Admit: 2017-07-17 | Discharge: 2017-07-17 | Disposition: A | Discharge status: Home / Self Care | Payer: Commercial Managed Care - PPO | Attending: Certified Registered" | Admitting: Certified Registered"

## 2017-07-17 ENCOUNTER — Ambulatory Visit
Admission: RE | Admit: 2017-07-17 | Discharge: 2017-07-17 | Disposition: A | Discharge status: Home / Self Care | Payer: Commercial Managed Care - PPO

## 2017-07-17 DIAGNOSIS — K7460 Unspecified cirrhosis of liver: Principal | ICD-10-CM

## 2017-07-18 ENCOUNTER — Ambulatory Visit
Admission: RE | Admit: 2017-07-18 | Discharge: 2017-07-18 | Disposition: A | Discharge status: Home / Self Care | Payer: Commercial Managed Care - PPO

## 2017-07-18 DIAGNOSIS — Z1239 Encounter for other screening for malignant neoplasm of breast: Principal | ICD-10-CM

## 2017-07-20 NOTE — Unmapped (Signed)
Per test claim for Harvoni at the [facility], approved for $0

## 2017-07-25 NOTE — Unmapped (Signed)
Initial Counseling for HCV Treatment     Planned regimen: Harvoni (ledipasvir/sofosbuvir 90/400mg ) x 12 weeks  Planned start date: 07/28/17    Pharmacy: [facility] [phone]    Current medications: metformin, lisinopril, tiotropium, acetaminophen prn    Patient is ready to start Harvoni.     Following topics were discussed during counseling:   Patient Counseling    Counseled the patient on the following:  doses and administration discussed, possible adverse effects and management discussed, possible drug and prescription drug interactions discussed, possible drug and OTC drug and food interactions discussed, lab monitoring and follow-up discussed, adherence and missed doses discussed, pharmacy contact information discussed        1. Indications for medication, dosage and administration.     A. Harvoni 90/400mg  1 tablet to take daily with or without food. Patient plans to take in the mornings.      2. Common side effects of medications and management strategies. (fatigue, headache)      3. Importance of adherence to regimen, follow-up clinic visits and lab monitoring.     A. Asked patient to call [facility] [phone] to establish start date for treatment and to schedule appointment 4 weeks before starting treatment. Also sent an e-mail to [facility] to call patient for follow up appointment.      4. Drug-drug interaction.  Drug Interactions    Drug interactions evaluated:  yes  Clinically relevant drug interactions identified:  no       A. Current medications have been reviewed and assessed for possible interaction.  We discussed the mechanism of drug-drug interaction with acid lowering agents. Advised to check with MD or pharmacist before taking any OTC/herbal medications, with emphasis regarding indigestion/heartburn medications.  Denies use of herbal medication such as milk thistle or St. John's wart.  Allergies have been verified. Denies alcohol.  Recommended to discontinue marijuana again.      5. Importance of informing pharmacy and clinic of updated contact information. Stressed importance of being able to reach over the phone to set up refills. Advised patient to call pharmacy when down to about 7 day supply left to ensure there's no interruption in therapy.  Shipping Information    Delivery Scheduled:  Yes  Delivery Date:  07/27/17  Medications to be Shipped:  Harvoni           Patient verbalized understanding. Provided contact information for any questions/concerns.       Park Breed, Pharm D., BCPS, BCGP, CPP  [facility]  [address]  [place], [place] [address]  [phone]    July 25, 2017 9:44 AM

## 2017-07-26 MED FILL — HARVONI/90-400MG/TABS: HARVONI/90-400MG/TABS | 28 days supply | Qty: 28 | Fill #0

## 2017-07-30 NOTE — Unmapped (Signed)
Carol Arias called to schedule her 4 week treatment follow-up appointment.  Carol Arias started her medication on 07/28/17, she is scheduled for her treatment follow-up appointment on 09/19/17 with Owens Shark.  Carol Arias was previously scheduled to see Owens Shark on 10/10/17 that appointment was cancelled,  Carol Arias was made aware of the cancellation.

## 2017-08-08 NOTE — Unmapped (Signed)
I was the supervising physician in the delivery of the service. Alba Destine, MD

## 2017-08-14 NOTE — Unmapped (Signed)
Follow-Up Counseling for HCV Treatment    Regimen: Harvoni (ledipasvir/sofosbuvir 90/400mg ) x 12 weeks  Start Date: 07/28/17  Completed Treatment Week #2    Pharmacy: [facility]    Following topics were reviewed during the phone call:    1. Medication administration - Takes Harvoni daily in the mornings.    2. Importance of adherence - Denies any missed doses.  Pill count over the phone revealed #9 tablets which is 1 tablet short. Denies doubling up on doses or misplacing. Stressed importance of adherence.     3. Side effects - Reports muscle cramps but she has had this prior to starting Harvoni treatment. Advised to keep well hydrated.     4. Drug-drug interaction - Denies any changes. No statin. No heartburn/antacid medications.    5. Follow up - Has follow up appointment scheduled in HCV treatment clinic on 09/19/17 but will change to 08/27/17 1:15pm which as just opened up for her Tw#4 follow up..  Advised patient to call pharmacy when down to about 7 day supply left to ensure there's no interruption in therapy.      All questions were answered.    Park Breed, Pharm D., BCPS, BCGP, CPP  [facility]  [address]  [place], [place] [address]  [phone]

## 2017-08-14 NOTE — Unmapped (Signed)
Specialty Pharmacy - Hepatitis C Medication Refill Coordination      Julina Altmann is a 46 y.o. female contacted today regarding refills of her specialty medication(s).sofosbuvir-ledipasvir (HARVONI)     Treatment start date: 07/28/17  Treatment duration: 12 weeks    Current treatment week: 3      Reviewed and verified with patient:      Specialty medication(s) and dose(s) confirmed: yes  Changes to medications: no  Changes to insurance: no    Medication Adherence    Specialty Medication:  HARVONI QOH  8  Patient is on additional specialty medications:  No  Informant:  patient  Scientist, clinical (histocompatibility and immunogenetics) for Adherence:  healthcare provider  Confirmed Plan for Next Specialty Medication Refill:  delivery by pharmacy  Refills Needed for Supportive Medications:  not needed  Medication Assistance Program  Refill Coordination  Has the Patients' Contact Information Changed:  No    Is the Shipping Address Different:  No    Shipping Information  Delivery Scheduled:  Yes  Delivery Date:  08/17/17  Medications to be Shipped:  HARVONI          Drug Interactions    Clinically relevant drug interactions identified:  no       PATIENT SHOULD HAVE 10 TABLETS LEFT, REPORTS 8. REPORTS VOMITING 30 MINUTES AFTER FIRST DOSE. HAVING ISSUES WITH CRAMPS, TRANSFERRED TO [facility] FOR CONSULT.         Follow-up: 21 day(s)  Does Margart have follow up appointment scheduled with clinic? Yes, appointment is scheduled and patient is aware    Jolene Schimke  Specialty Pharmacy Technician

## 2017-08-20 NOTE — Unmapped (Signed)
Reason for call: Called to notify patient her prescription has been transferred to [facility]    Hep C  Genotype: 1a  (Ref 10/27/16 pg 6)  Treatment:Harvoni x 12 weeks  Start date: 07/28/17  Fibrosis:F4 (60.1kPa) on Fibroscan on 06/12/17  TW # 4    Called patient at phone number [phone]. Notified patient that I spent time on the phone with Sasha from [facility] at phone number [phone]. Patient's Harvoni prescription was transferred to [facility] for the 2 remaining refills per insurance requirements. Verbal order was placed to Pharmacist at [facility], Notified Pharmacist that patient is down to 3 remaining pills. Pharmacist was going to place the order stat so patient will receive her medication in the next two days. Patient was called and updated and stated she did receive a call from [employer] and notified of shipment.       Vertell Limber RN, BSN  Nursing Care Coordinator   Pharmacy Adult GI Medicine  [facility]  [address]   [place], [place] [address]  [phone]

## 2017-08-29 NOTE — Unmapped (Addendum)
Reason for call: Coordinate TW # 4 safety labs    Hep C  Genotype: 1a  (Ref 10/27/16 pg 6),  Treatment: Harvoni x 12 weeks  Start date: 07/28/17  Fibrosis: F4 (60.1kPa) on Fibroscan on 06/12/17  TW # 5    Patient's TW # 4 appointment with Provider was canceled on 08/27/17 d/t weather. Patient will need safety labs coordinated, follow up appointment scheduled.  Called patient at phone number [phone] and left VM requesting patient to return call. Contact information provided.       Vertell Limber RN, BSN  Nursing Care Coordinator   Pharmacy Adult GI Medicine  [facility]  [address]   [place], [place] [address]  [phone]

## 2017-08-31 NOTE — Unmapped (Addendum)
Reason for call: Coordinate TW # 4 safety labs  ??  Hep C  Genotype: 1a ??(Ref 10/27/16 pg 6),  Treatment: Harvoni x 12 weeks  Start date: 07/28/17  Fibrosis: F4 (60.1kPa) on Fibroscan on 06/12/17  TW # 5  ??  Patient's TW # 4 appointment with Provider was canceled on 08/27/17 d/t weather. Patient will need safety labs coordinated, and follow up appointment scheduled. Called and spoke with patient at phone number [phone]. Patient stated she is able to have safety labs completed today at [facility] in [place], [place], [address], [address]. Phone: [phone] Fax: [phone]. Coordinated EOT appointment with Provider. New appointment is scheduled on 10/23/17 at 1400. Stressed the importance of EOT appointment. Patient verbalized understanding.       Vertell Limber RN, BSN  Nursing Care Coordinator   Pharmacy Adult GI Medicine  [facility]  [address]   [place], [place] [address]  [phone]

## 2017-09-01 LAB — CBC W/ DIFFERENTIAL
BANDED NEUTROPHILS ABSOLUTE COUNT: 0 10*3/uL (ref 0.0–0.1)
BASOPHILS ABSOLUTE COUNT: 0 10*3/uL (ref 0.0–0.2)
BASOPHILS RELATIVE PERCENT: 0 %
EOSINOPHILS RELATIVE PERCENT: 4 %
HEMOGLOBIN: 12 g/dL (ref 11.1–15.9)
IMMATURE GRANULOCYTES: 0 %
LYMPHOCYTES ABSOLUTE COUNT: 1.5 10*3/uL (ref 0.7–3.1)
LYMPHOCYTES RELATIVE PERCENT: 32 %
MEAN CORPUSCULAR HEMOGLOBIN CONC: 33.6 g/dL (ref 31.5–35.7)
MEAN CORPUSCULAR HEMOGLOBIN: 32.5 pg (ref 26.6–33.0)
MEAN CORPUSCULAR VOLUME: 97 fL (ref 79–97)
MONOCYTES ABSOLUTE COUNT: 0.3 10*3/uL (ref 0.1–0.9)
MONOCYTES RELATIVE PERCENT: 7 %
NEUTROPHILS ABSOLUTE COUNT: 2.7 10*3/uL (ref 1.4–7.0)
NEUTROPHILS RELATIVE PERCENT: 57 %
RED BLOOD CELL COUNT: 3.69 x10E6/uL — ABNORMAL LOW (ref 3.77–5.28)
RED CELL DISTRIBUTION WIDTH: 15.9 % — ABNORMAL HIGH (ref 12.3–15.4)
WHITE BLOOD CELL COUNT: 4.7 10*3/uL (ref 3.4–10.8)

## 2017-09-01 LAB — CREATININE: GFR MDRD NON AF AMER: 106 mL/min/{1.73_m2}

## 2017-09-01 LAB — HEPATIC FUNCTION PANEL
ALBUMIN: 3.4 g/dL — ABNORMAL LOW (ref 3.5–5.5)
ALT (SGPT): 26 IU/L (ref 0–32)
BILIRUBIN DIRECT: 0.28 mg/dL (ref 0.00–0.40)
BILIRUBIN TOTAL: 0.7 mg/dL (ref 0.0–1.2)
TOTAL PROTEIN: 8 g/dL (ref 6.0–8.5)

## 2017-09-01 LAB — BLOOD UREA NITROGEN: Lab: 3 — ABNORMAL LOW

## 2017-09-01 LAB — GFR MDRD NON AF AMER: Lab: 106

## 2017-09-01 LAB — RED BLOOD CELL COUNT: Lab: 3.69 — ABNORMAL LOW

## 2017-09-01 LAB — ALKALINE PHOSPHATASE: Lab: 91

## 2017-09-03 NOTE — Unmapped (Addendum)
Specialty Pharmacy - Hepatitis C Medication Refill Coordination    **--ERROR IN ENCOUNTER: NOT SENDING PATIENT MUST USE [facility]--**   CALLED PATIENT, SHE IS AWARE THAT REFILL MUST COME FROM [facility] PER MSG FROM JG 09/04/17, AND PATIENT WILL CALL [facility], HAS NUMBER FROM PREVIOUS FILL.     Carol Arias is a 46 y.o. female contacted today regarding refills of her specialty medication(s).sofosbuvir-ledipasvir (HARVONI)     Treatment start date: 07/28/17  Treatment duration: 12 weeks    Current treatment week: 6  QOH: #17     Reviewed and verified with patient:      Specialty medication(s) and dose(s) confirmed: yes  Changes to medications: no  Changes to insurance: no    Medication Adherence    Patient Reported X Missed Doses in the Last Month:  0  Specialty Medication:  HARVONI QOH 17   Patient is on additional specialty medications:  No  Informant:  patient  Scientist, clinical (histocompatibility and immunogenetics) for Adherence:  healthcare provider  Confirmed Plan for Next Specialty Medication Refill:  delivery by pharmacy  Refills Needed for Supportive Medications:  not needed  Medication Assistance Program  Refill Coordination  Has the Patients' Contact Information Changed:  No    Is the Shipping Address Different:  No    Shipping Information  Delivery Scheduled:  Yes  Delivery Date:  09/13/17  Medications to be Shipped:  HARVONI          Drug Interactions    Clinically relevant drug interactions identified:  no           This is the patients last refill, the patient filled once at [facility] on 12/4       Follow-up: 28 day(s)  Does Jerianne have follow up appointment scheduled with clinic? Yes, appointment is scheduled and patient is aware    Jolene Schimke  Specialty Pharmacy Technician

## 2017-09-05 LAB — HEPATITIS C QUANTITATION: Lab: <12

## 2017-09-05 LAB — HEPATITIS C RNA, QUANTITATIVE, PCR: HEPATITIS C QUANTITATION: <12 [IU]/mL

## 2017-09-05 NOTE — Unmapped (Addendum)
Reason for call: Provide lab results from 08/31/17    Hep C  Genotype: 1a ??(Ref 10/27/16 pg 6),  Treatment: Harvoni x 12 weeks  Start date: 07/28/17  Fibrosis: F4 (60.1kPa) on Fibroscan on 06/12/17  TW # 6    Called and provided lab results from 08/31/17 to patient at phone number [phone]. Notified patient that her labs look stable and her HCV RNA is still detected but <12 IU/ml. Notified patient that the treatment is working and stressed the importance of completing her full 12 week course. Please remind her of her EOT appointment scheduled on 10/23/17 at 1400. Patient verbalized understanding. Notified patient she needs to call [facility] and schedule her next shipment of Harvoni today to ensure there is no gaps in tx. Provided contact number: [phone]. Patient verbalized understanding.       Vertell Limber RN, BSN  Nursing Care Coordinator   Pharmacy Adult GI Medicine  [facility]  [address]   [place], [place] [address]  [phone]

## 2017-09-05 NOTE — Unmapped (Signed)
NOT SENDING MEDICATION OUT FOR DELIVERY        I called the patient and told her that we could not send out the medication, that per insurance it would need to come from [place] (per encounter message from Rodri­guez Hevia, CPP). She inquired about how to contact [facility], and I suggested she use the number on her bottle and call, since I have never used [facility], I didn't want to misdirect her. She confirmed having the number and said she would call.

## 2017-09-15 MED ORDER — LISINOPRIL 40 MG TABLET
ORAL_TABLET | 0 refills | 0 days | Status: CP
Start: 2017-09-15 — End: 2017-10-23

## 2017-10-04 MED ORDER — SPIRIVA WITH HANDIHALER 18 MCG AND INHALATION CAPSULES
ORAL_CAPSULE | 0 refills | 0 days | Status: CP
Start: 2017-10-04 — End: 2019-02-12

## 2017-10-10 MED ORDER — LISINOPRIL 20 MG TABLET
ORAL_TABLET | Freq: Two times a day (BID) | ORAL | 0 refills | 0 days | Status: CP
Start: 2017-10-10 — End: 2017-10-23

## 2017-10-23 ENCOUNTER — Ambulatory Visit: Admit: 2017-10-23 | Discharge: 2017-10-24 | Attending: Family | Primary: Family

## 2017-10-23 DIAGNOSIS — K7460 Unspecified cirrhosis of liver: Principal | ICD-10-CM

## 2017-10-23 DIAGNOSIS — B182 Chronic viral hepatitis C: Secondary | ICD-10-CM

## 2017-10-23 DIAGNOSIS — R188 Other ascites: Secondary | ICD-10-CM

## 2017-10-23 DIAGNOSIS — Z13820 Encounter for screening for osteoporosis: Secondary | ICD-10-CM

## 2017-10-28 DIAGNOSIS — D72829 Elevated white blood cell count, unspecified: Secondary | ICD-10-CM | POA: Diagnosis not present

## 2017-10-28 DIAGNOSIS — B1920 Unspecified viral hepatitis C without hepatic coma: Secondary | ICD-10-CM | POA: Diagnosis not present

## 2017-10-28 DIAGNOSIS — K7460 Unspecified cirrhosis of liver: Secondary | ICD-10-CM

## 2017-10-28 DIAGNOSIS — I8500 Esophageal varices without bleeding: Secondary | ICD-10-CM | POA: Diagnosis not present

## 2017-10-28 DIAGNOSIS — I10 Essential (primary) hypertension: Secondary | ICD-10-CM | POA: Diagnosis not present

## 2017-10-28 DIAGNOSIS — Z87898 Personal history of other specified conditions: Secondary | ICD-10-CM | POA: Diagnosis not present

## 2017-10-28 DIAGNOSIS — Z720 Tobacco use: Secondary | ICD-10-CM | POA: Diagnosis not present

## 2017-10-28 DIAGNOSIS — R112 Nausea with vomiting, unspecified: Secondary | ICD-10-CM | POA: Diagnosis not present

## 2017-10-28 DIAGNOSIS — K529 Noninfective gastroenteritis and colitis, unspecified: Secondary | ICD-10-CM | POA: Diagnosis not present

## 2017-10-28 DIAGNOSIS — D649 Anemia, unspecified: Secondary | ICD-10-CM | POA: Diagnosis not present

## 2017-10-29 DIAGNOSIS — Z87898 Personal history of other specified conditions: Secondary | ICD-10-CM | POA: Diagnosis not present

## 2017-10-29 DIAGNOSIS — Z720 Tobacco use: Secondary | ICD-10-CM | POA: Diagnosis not present

## 2017-10-29 DIAGNOSIS — I8500 Esophageal varices without bleeding: Secondary | ICD-10-CM | POA: Diagnosis not present

## 2017-10-29 DIAGNOSIS — K529 Noninfective gastroenteritis and colitis, unspecified: Secondary | ICD-10-CM | POA: Diagnosis not present

## 2017-10-29 DIAGNOSIS — K7460 Unspecified cirrhosis of liver: Secondary | ICD-10-CM | POA: Diagnosis not present

## 2017-10-29 DIAGNOSIS — I10 Essential (primary) hypertension: Secondary | ICD-10-CM | POA: Diagnosis not present

## 2017-10-29 DIAGNOSIS — R112 Nausea with vomiting, unspecified: Secondary | ICD-10-CM | POA: Diagnosis not present

## 2017-10-29 DIAGNOSIS — B1920 Unspecified viral hepatitis C without hepatic coma: Secondary | ICD-10-CM | POA: Diagnosis not present

## 2017-10-29 DIAGNOSIS — D72829 Elevated white blood cell count, unspecified: Secondary | ICD-10-CM | POA: Diagnosis not present

## 2017-10-29 DIAGNOSIS — D649 Anemia, unspecified: Secondary | ICD-10-CM | POA: Diagnosis not present

## 2017-10-30 DIAGNOSIS — Z720 Tobacco use: Secondary | ICD-10-CM | POA: Diagnosis not present

## 2017-10-30 DIAGNOSIS — K7460 Unspecified cirrhosis of liver: Secondary | ICD-10-CM | POA: Diagnosis not present

## 2017-10-30 DIAGNOSIS — I10 Essential (primary) hypertension: Secondary | ICD-10-CM | POA: Diagnosis not present

## 2017-10-30 DIAGNOSIS — Z87898 Personal history of other specified conditions: Secondary | ICD-10-CM | POA: Diagnosis not present

## 2017-11-05 ENCOUNTER — Ambulatory Visit: Admit: 2017-11-05 | Discharge: 2017-11-05 | Attending: Family Medicine | Primary: Family Medicine

## 2017-11-05 DIAGNOSIS — Z09 Encounter for follow-up examination after completed treatment for conditions other than malignant neoplasm: Secondary | ICD-10-CM

## 2017-11-05 DIAGNOSIS — K922 Gastrointestinal hemorrhage, unspecified: Principal | ICD-10-CM

## 2017-12-03 ENCOUNTER — Encounter
Admit: 2017-12-03 | Discharge: 2017-12-04 | Payer: PRIVATE HEALTH INSURANCE | Attending: Family Medicine | Primary: Family Medicine

## 2017-12-03 DIAGNOSIS — F329 Major depressive disorder, single episode, unspecified: Secondary | ICD-10-CM

## 2017-12-03 DIAGNOSIS — E118 Type 2 diabetes mellitus with unspecified complications: Principal | ICD-10-CM

## 2017-12-03 DIAGNOSIS — J449 Chronic obstructive pulmonary disease, unspecified: Secondary | ICD-10-CM

## 2017-12-03 DIAGNOSIS — B182 Chronic viral hepatitis C: Secondary | ICD-10-CM

## 2017-12-03 MED ORDER — ESCITALOPRAM 20 MG TABLET
ORAL_TABLET | Freq: Every day | ORAL | 0 refills | 0.00000 days | Status: CP
Start: 2017-12-03 — End: 2018-03-05

## 2017-12-04 MED ORDER — METFORMIN 1,000 MG TABLET
ORAL_TABLET | Freq: Two times a day (BID) | ORAL | 1 refills | 0 days | Status: CP
Start: 2017-12-04 — End: 2018-07-21

## 2018-01-30 ENCOUNTER — Encounter: Admit: 2018-01-30 | Discharge: 2018-01-30 | Payer: PRIVATE HEALTH INSURANCE

## 2018-01-30 ENCOUNTER — Encounter: Admit: 2018-01-30 | Discharge: 2018-01-30 | Payer: PRIVATE HEALTH INSURANCE | Attending: Family | Primary: Family

## 2018-01-30 DIAGNOSIS — K7460 Unspecified cirrhosis of liver: Principal | ICD-10-CM

## 2018-01-30 DIAGNOSIS — K5909 Other constipation: Principal | ICD-10-CM

## 2018-01-30 DIAGNOSIS — Z13820 Encounter for screening for osteoporosis: Principal | ICD-10-CM

## 2018-01-30 DIAGNOSIS — R188 Other ascites: Secondary | ICD-10-CM

## 2018-01-30 DIAGNOSIS — B182 Chronic viral hepatitis C: Secondary | ICD-10-CM

## 2018-01-30 MED ORDER — OMEPRAZOLE 20 MG CAPSULE,DELAYED RELEASE
ORAL_CAPSULE | Freq: Every day | ORAL | 2 refills | 0.00000 days | Status: CP
Start: 2018-01-30 — End: 2018-08-14

## 2018-01-30 MED ORDER — LACTULOSE 10 GRAM/15 ML ORAL SOLUTION
Freq: Three times a day (TID) | ORAL | 6 refills | 0 days | Status: CP
Start: 2018-01-30 — End: ?

## 2018-03-05 ENCOUNTER — Encounter: Admit: 2018-03-05 | Discharge: 2018-03-06 | Payer: MEDICAID | Attending: Family Medicine | Primary: Family Medicine

## 2018-03-05 DIAGNOSIS — J449 Chronic obstructive pulmonary disease, unspecified: Secondary | ICD-10-CM

## 2018-03-05 DIAGNOSIS — F329 Major depressive disorder, single episode, unspecified: Secondary | ICD-10-CM

## 2018-03-05 DIAGNOSIS — B182 Chronic viral hepatitis C: Secondary | ICD-10-CM

## 2018-03-05 DIAGNOSIS — R110 Nausea: Secondary | ICD-10-CM

## 2018-03-05 DIAGNOSIS — E118 Type 2 diabetes mellitus with unspecified complications: Principal | ICD-10-CM

## 2018-03-05 MED ORDER — ONDANSETRON HCL 4 MG TABLET
ORAL_TABLET | Freq: Every day | ORAL | 1 refills | 0 days | Status: CP | PRN
Start: 2018-03-05 — End: 2019-03-05

## 2018-03-05 MED ORDER — ESCITALOPRAM 10 MG TABLET
ORAL_TABLET | Freq: Every day | ORAL | 3 refills | 0 days | Status: CP
Start: 2018-03-05 — End: 2018-08-14

## 2018-04-04 MED ORDER — PANTOPRAZOLE 40 MG TABLET,DELAYED RELEASE
ORAL_TABLET | Freq: Every day | ORAL | 0 refills | 0.00000 days | Status: CP
Start: 2018-04-04 — End: 2018-05-04

## 2018-04-11 ENCOUNTER — Encounter: Admit: 2018-04-11 | Discharge: 2018-04-11 | Payer: PRIVATE HEALTH INSURANCE

## 2018-04-11 DIAGNOSIS — K7460 Unspecified cirrhosis of liver: Principal | ICD-10-CM

## 2018-04-11 DIAGNOSIS — B182 Chronic viral hepatitis C: Secondary | ICD-10-CM

## 2018-04-11 MED ORDER — LISINOPRIL 20 MG TABLET
ORAL_TABLET | 0 refills | 0 days | Status: CP
Start: 2018-04-11 — End: 2018-07-17

## 2018-05-13 MED ORDER — ESCITALOPRAM 20 MG TABLET
ORAL_TABLET | 0 refills | 0 days | Status: CP
Start: 2018-05-13 — End: 2018-08-14

## 2018-07-18 MED ORDER — LISINOPRIL 20 MG TABLET
ORAL_TABLET | 0 refills | 0 days | Status: CP
Start: 2018-07-18 — End: 2018-08-14

## 2018-07-22 MED ORDER — METFORMIN 1,000 MG TABLET
ORAL_TABLET | 0 refills | 0 days | Status: CP
Start: 2018-07-22 — End: 2018-08-18

## 2018-08-07 ENCOUNTER — Encounter: Admit: 2018-08-07 | Discharge: 2018-08-07 | Payer: PRIVATE HEALTH INSURANCE

## 2018-08-07 ENCOUNTER — Encounter: Admit: 2018-08-07 | Discharge: 2018-08-07 | Payer: PRIVATE HEALTH INSURANCE | Attending: Family | Primary: Family

## 2018-08-07 DIAGNOSIS — D649 Anemia, unspecified: Secondary | ICD-10-CM

## 2018-08-07 DIAGNOSIS — K769 Liver disease, unspecified: Secondary | ICD-10-CM

## 2018-08-07 DIAGNOSIS — K7460 Unspecified cirrhosis of liver: Principal | ICD-10-CM

## 2018-08-07 DIAGNOSIS — Z1321 Encounter for screening for nutritional disorder: Secondary | ICD-10-CM

## 2018-08-07 DIAGNOSIS — Z13820 Encounter for screening for osteoporosis: Principal | ICD-10-CM

## 2018-08-07 DIAGNOSIS — K5900 Constipation, unspecified: Secondary | ICD-10-CM

## 2018-08-07 DIAGNOSIS — R188 Other ascites: Secondary | ICD-10-CM

## 2018-08-07 DIAGNOSIS — Z8619 Personal history of other infectious and parasitic diseases: Secondary | ICD-10-CM

## 2018-08-07 MED ORDER — LINACLOTIDE 290 MCG CAPSULE
ORAL_CAPSULE | Freq: Every day | ORAL | 6 refills | 0.00000 days | Status: CP
Start: 2018-08-07 — End: 2018-11-27

## 2018-08-14 ENCOUNTER — Ambulatory Visit
Admit: 2018-08-14 | Discharge: 2018-08-15 | Payer: PRIVATE HEALTH INSURANCE | Attending: Family Medicine | Primary: Family Medicine

## 2018-08-14 DIAGNOSIS — F329 Major depressive disorder, single episode, unspecified: Secondary | ICD-10-CM

## 2018-08-14 DIAGNOSIS — E118 Type 2 diabetes mellitus with unspecified complications: Principal | ICD-10-CM

## 2018-08-14 DIAGNOSIS — R109 Unspecified abdominal pain: Secondary | ICD-10-CM

## 2018-08-14 MED ORDER — ESCITALOPRAM 20 MG TABLET
ORAL_TABLET | Freq: Every day | ORAL | 0 refills | 0.00000 days | Status: CP
Start: 2018-08-14 — End: 2018-11-27

## 2018-08-14 MED ORDER — BLOOD SUGAR DIAGNOSTIC STRIPS
11 refills | 0 days | Status: CP
Start: 2018-08-14 — End: 2019-03-07

## 2018-08-14 MED ORDER — OMEPRAZOLE 20 MG CAPSULE,DELAYED RELEASE
ORAL_CAPSULE | Freq: Every day | ORAL | 2 refills | 0.00000 days | Status: CP
Start: 2018-08-14 — End: 2019-02-04

## 2018-08-14 MED ORDER — LISINOPRIL 20 MG TABLET
ORAL_TABLET | Freq: Two times a day (BID) | ORAL | 0 refills | 0 days | Status: CP
Start: 2018-08-14 — End: 2018-11-07

## 2018-08-19 MED ORDER — METFORMIN 1,000 MG TABLET
0 refills | 0 days | Status: CP
Start: 2018-08-19 — End: 2018-10-02

## 2018-09-03 MED ORDER — FERROUS SULFATE 325 MG (65 MG IRON) TABLET,DELAYED RELEASE
ORAL_TABLET | Freq: Two times a day (BID) | ORAL | 2 refills | 0.00000 days | Status: CP
Start: 2018-09-03 — End: ?

## 2018-09-23 ENCOUNTER — Encounter
Admit: 2018-09-23 | Discharge: 2018-09-24 | Payer: PRIVATE HEALTH INSURANCE | Attending: Family Medicine | Primary: Family Medicine

## 2018-09-23 DIAGNOSIS — F17200 Nicotine dependence, unspecified, uncomplicated: Secondary | ICD-10-CM

## 2018-09-23 DIAGNOSIS — G8929 Other chronic pain: Principal | ICD-10-CM

## 2018-09-23 DIAGNOSIS — J449 Chronic obstructive pulmonary disease, unspecified: Secondary | ICD-10-CM

## 2018-10-02 MED ORDER — METFORMIN 1,000 MG TABLET
0 refills | 0 days | Status: CP
Start: 2018-10-02 — End: 2018-12-23

## 2018-10-06 ENCOUNTER — Encounter: Payer: Self-pay | Admitting: Emergency Medicine

## 2018-10-06 ENCOUNTER — Inpatient Hospital Stay (HOSPITAL_COMMUNITY)
Admission: EM | Admit: 2018-10-06 | Discharge: 2018-10-09 | DRG: 432 | Disposition: A | Discharge status: Home / Self Care | Payer: Medicaid Other | Attending: Internal Medicine | Admitting: Internal Medicine

## 2018-10-06 ENCOUNTER — Other Ambulatory Visit: Payer: Self-pay

## 2018-10-06 ENCOUNTER — Emergency Department (HOSPITAL_COMMUNITY): Payer: Medicaid Other

## 2018-10-06 DIAGNOSIS — R413 Other amnesia: Secondary | ICD-10-CM | POA: Diagnosis present

## 2018-10-06 DIAGNOSIS — K922 Gastrointestinal hemorrhage, unspecified: Secondary | ICD-10-CM | POA: Diagnosis not present

## 2018-10-06 DIAGNOSIS — Z79899 Other long term (current) drug therapy: Secondary | ICD-10-CM

## 2018-10-06 DIAGNOSIS — I781 Nevus, non-neoplastic: Secondary | ICD-10-CM | POA: Diagnosis present

## 2018-10-06 DIAGNOSIS — I8511 Secondary esophageal varices with bleeding: Secondary | ICD-10-CM | POA: Diagnosis present

## 2018-10-06 DIAGNOSIS — K766 Portal hypertension: Secondary | ICD-10-CM | POA: Diagnosis present

## 2018-10-06 DIAGNOSIS — Z791 Long term (current) use of non-steroidal anti-inflammatories (NSAID): Secondary | ICD-10-CM | POA: Diagnosis not present

## 2018-10-06 DIAGNOSIS — I959 Hypotension, unspecified: Secondary | ICD-10-CM | POA: Diagnosis present

## 2018-10-06 DIAGNOSIS — E876 Hypokalemia: Secondary | ICD-10-CM | POA: Diagnosis present

## 2018-10-06 DIAGNOSIS — D649 Anemia, unspecified: Secondary | ICD-10-CM | POA: Diagnosis present

## 2018-10-06 DIAGNOSIS — I8500 Esophageal varices without bleeding: Secondary | ICD-10-CM | POA: Diagnosis present

## 2018-10-06 DIAGNOSIS — K7460 Unspecified cirrhosis of liver: Secondary | ICD-10-CM | POA: Diagnosis present

## 2018-10-06 DIAGNOSIS — Z90710 Acquired absence of both cervix and uterus: Secondary | ICD-10-CM

## 2018-10-06 DIAGNOSIS — R1110 Vomiting, unspecified: Secondary | ICD-10-CM

## 2018-10-06 DIAGNOSIS — K920 Hematemesis: Secondary | ICD-10-CM | POA: Diagnosis present

## 2018-10-06 DIAGNOSIS — I8510 Secondary esophageal varices without bleeding: Secondary | ICD-10-CM | POA: Diagnosis not present

## 2018-10-06 DIAGNOSIS — B1920 Unspecified viral hepatitis C without hepatic coma: Secondary | ICD-10-CM | POA: Diagnosis present

## 2018-10-06 DIAGNOSIS — B182 Chronic viral hepatitis C: Secondary | ICD-10-CM | POA: Diagnosis present

## 2018-10-06 DIAGNOSIS — Z7984 Long term (current) use of oral hypoglycemic drugs: Secondary | ICD-10-CM

## 2018-10-06 DIAGNOSIS — Z888 Allergy status to other drugs, medicaments and biological substances status: Secondary | ICD-10-CM

## 2018-10-06 DIAGNOSIS — K219 Gastro-esophageal reflux disease without esophagitis: Secondary | ICD-10-CM | POA: Diagnosis present

## 2018-10-06 DIAGNOSIS — K3184 Gastroparesis: Secondary | ICD-10-CM | POA: Diagnosis present

## 2018-10-06 DIAGNOSIS — J449 Chronic obstructive pulmonary disease, unspecified: Secondary | ICD-10-CM | POA: Diagnosis present

## 2018-10-06 DIAGNOSIS — I10 Essential (primary) hypertension: Secondary | ICD-10-CM | POA: Diagnosis present

## 2018-10-06 DIAGNOSIS — R69 Illness, unspecified: Secondary | ICD-10-CM

## 2018-10-06 DIAGNOSIS — E119 Type 2 diabetes mellitus without complications: Secondary | ICD-10-CM

## 2018-10-06 DIAGNOSIS — E1143 Type 2 diabetes mellitus with diabetic autonomic (poly)neuropathy: Secondary | ICD-10-CM | POA: Diagnosis present

## 2018-10-06 DIAGNOSIS — F17210 Nicotine dependence, cigarettes, uncomplicated: Secondary | ICD-10-CM | POA: Diagnosis present

## 2018-10-06 DIAGNOSIS — R188 Other ascites: Secondary | ICD-10-CM | POA: Diagnosis present

## 2018-10-06 HISTORY — DX: Chronic obstructive pulmonary disease, unspecified: J44.9

## 2018-10-06 HISTORY — DX: Gastro-esophageal reflux disease without esophagitis: K21.9

## 2018-10-06 HISTORY — DX: Unspecified viral hepatitis C without hepatic coma: B19.20

## 2018-10-06 HISTORY — DX: Essential (primary) hypertension: I10

## 2018-10-06 HISTORY — DX: Unspecified cirrhosis of liver: K74.60

## 2018-10-06 HISTORY — DX: Gastroparesis: K31.84

## 2018-10-06 HISTORY — DX: Anemia, unspecified: D64.9

## 2018-10-06 HISTORY — DX: Type 2 diabetes mellitus without complications: E11.9

## 2018-10-06 LAB — PROTIME-INR
INR: 1.44
PROTHROMBIN TIME: 17.4 s — AB (ref 11.4–15.2)

## 2018-10-06 LAB — CBC WITH DIFFERENTIAL/PLATELET
Abs Immature Granulocytes: 0.02 10*3/uL (ref 0.00–0.07)
Basophils Absolute: 0 10*3/uL (ref 0.0–0.1)
Basophils Relative: 0 %
Eosinophils Absolute: 0.2 10*3/uL (ref 0.0–0.5)
Eosinophils Relative: 3 %
HCT: 31.6 % — ABNORMAL LOW (ref 36.0–46.0)
Hemoglobin: 9.7 g/dL — ABNORMAL LOW (ref 12.0–15.0)
Immature Granulocytes: 0 %
Lymphocytes Relative: 29 %
Lymphs Abs: 2 10*3/uL (ref 0.7–4.0)
MCH: 27.6 pg (ref 26.0–34.0)
MCHC: 30.7 g/dL (ref 30.0–36.0)
MCV: 90 fL (ref 80.0–100.0)
Monocytes Absolute: 0.6 10*3/uL (ref 0.1–1.0)
Monocytes Relative: 9 %
NEUTROS ABS: 4.1 10*3/uL (ref 1.7–7.7)
Neutrophils Relative %: 59 %
Platelets: 92 10*3/uL — ABNORMAL LOW (ref 150–400)
RBC: 3.51 MIL/uL — ABNORMAL LOW (ref 3.87–5.11)
RDW: 20.5 % — ABNORMAL HIGH (ref 11.5–15.5)
WBC: 6.9 10*3/uL (ref 4.0–10.5)
nRBC: 0 % (ref 0.0–0.2)

## 2018-10-06 LAB — COMPREHENSIVE METABOLIC PANEL
ALT: 25 U/L (ref 0–44)
AST: 33 U/L (ref 15–41)
Albumin: 2.8 g/dL — ABNORMAL LOW (ref 3.5–5.0)
Alkaline Phosphatase: 54 U/L (ref 38–126)
Anion gap: 9 (ref 5–15)
BUN: 14 mg/dL (ref 6–20)
CHLORIDE: 102 mmol/L (ref 98–111)
CO2: 25 mmol/L (ref 22–32)
Calcium: 8.5 mg/dL — ABNORMAL LOW (ref 8.9–10.3)
Creatinine, Ser: 0.57 mg/dL (ref 0.44–1.00)
GFR calc Af Amer: >60 mL/min (ref >60)
GFR calc non Af Amer: >60 mL/min (ref >60)
Glucose, Bld: 142 mg/dL — ABNORMAL HIGH (ref 70–99)
POTASSIUM: 3.9 mmol/L (ref 3.5–5.1)
Sodium: 136 mmol/L (ref 135–145)
Total Bilirubin: 0.9 mg/dL (ref 0.3–1.2)
Total Protein: 7 g/dL (ref 6.5–8.1)

## 2018-10-06 LAB — POC OCCULT BLOOD, ED: Fecal Occult Bld: POSITIVE — AB

## 2018-10-06 LAB — I-STAT BETA HCG BLOOD, ED (MC, WL, AP ONLY): I-stat hCG, quantitative: <5 m[IU]/mL (ref <5)

## 2018-10-06 LAB — AMMONIA: Ammonia: 58 umol/L — ABNORMAL HIGH (ref 9–35)

## 2018-10-06 LAB — LIPASE, BLOOD: LIPASE: 27 U/L (ref 11–51)

## 2018-10-06 MED ORDER — ONDANSETRON HCL 4 MG PO TABS: 4.0000 mg | ORAL_TABLET | Freq: Four times a day (QID) | ORAL | Status: DC | PRN

## 2018-10-06 MED ORDER — SODIUM CHLORIDE 0.9 % IV SOLN
50.0000 ug/h | INTRAVENOUS | Status: DC
  Administered 2018-10-07 – 2018-10-08 (×2): 50 ug/h via INTRAVENOUS
  Filled 2018-10-06 (×7): qty 1

## 2018-10-06 MED ORDER — OCTREOTIDE LOAD VIA INFUSION
25.0000 ug | Freq: Once | INTRAVENOUS | Status: AC
  Administered 2018-10-07: 25 ug via INTRAVENOUS
  Filled 2018-10-06: qty 13

## 2018-10-06 MED ORDER — ONDANSETRON HCL 4 MG/2ML IJ SOLN
4.0000 mg | Freq: Once | INTRAMUSCULAR | Status: AC
  Administered 2018-10-06: 4 mg via INTRAVENOUS
  Filled 2018-10-06: qty 2

## 2018-10-06 MED ORDER — SODIUM CHLORIDE 0.9 % IV BOLUS
1000.0000 mL | Freq: Once | INTRAVENOUS | Status: AC
  Administered 2018-10-06: 1000 mL via INTRAVENOUS

## 2018-10-06 MED ORDER — SODIUM CHLORIDE 0.9 % IV SOLN
8.0000 mg/h | INTRAVENOUS | Status: DC
  Administered 2018-10-06 – 2018-10-08 (×3): 8 mg/h via INTRAVENOUS
  Administered 2018-10-09: 6.4 mg/h via INTRAVENOUS
  Filled 2018-10-06 (×7): qty 80

## 2018-10-06 MED ORDER — SODIUM CHLORIDE 0.9 % IV SOLN
80.0000 mg | Freq: Once | INTRAVENOUS | Status: AC
  Administered 2018-10-06: 80 mg via INTRAVENOUS
  Filled 2018-10-06: qty 80

## 2018-10-06 MED ORDER — SODIUM CHLORIDE 0.9 % IV SOLN
INTRAVENOUS | Status: DC
  Administered 2018-10-06: 20:00:00 via INTRAVENOUS

## 2018-10-06 MED ORDER — ONDANSETRON HCL 4 MG/2ML IJ SOLN
4.0000 mg | Freq: Four times a day (QID) | INTRAMUSCULAR | Status: DC | PRN
  Filled 2018-10-06: qty 2

## 2018-10-06 NOTE — H&P (Signed)
History and Physical    Erica Booth:[id number] DOB: 05/02/1971 DOA: 10/06/2018  PCP: Patient, No Pcp Per  Patient coming from: Home  Chief Complaint: Vomiting blood  HPI: Erica RankinMary Jo Petti is a 48 y.o. female with medical history significant of hepatitis C, cirrhosis of the liver with a history of esophageal varices, COPD, diabetes comes in with acute onset of vomiting coffee-ground emesis with some bright red streaking that started several hours ago.  She denies any melena or bright red blood per rectum.  She has been having some also epigastric abdominal pain that started today also.  She denies any fevers.  Patient is being referred for admission for upper GI bleed.  She is tach and away in the 130s with soft blood pressures.  In the emergency department Protonix drip has been started only.  She does have a history of anemia she does not know what her baseline hemoglobin is.  Review of Systems: As per HPI otherwise 10 point review of systems negative.   Past Medical History:  Diagnosis Date  . Acid reflux   . Anemia   . Cirrhosis (HCC)   . COPD (chronic obstructive pulmonary disease) (HCC)   . Diabetes mellitus without complication (HCC)   . Gastroparesis   . Hepatitis C   . Hypertension     Past Surgical History:  Procedure Laterality Date  . ABDOMINAL HYSTERECTOMY    . APPENDECTOMY    . EYE SURGERY    . KNEE REPAIR EXTENSOR MECHANISM       reports that she has been smoking cigarettes. She has a 16.50 pack-year smoking history. She has never used smokeless tobacco. She reports current drug use. Frequency: 2.00 times per week. Drug: Marijuana. She reports that she does not drink alcohol.  Allergies  Allergen Reactions  . Amitriptyline Other (See Comments)    Other reaction(s): Hallucinations Hallucinations    . Ondansetron Other (See Comments)    Makes her liver hurt  . Prednisone Other (See Comments)    Makes her liver hurt.    No family history on  file.  Prior to Admission medications   Medication Sig Start Date End Date Taking? Authorizing Provider  ibuprofen (ADVIL,MOTRIN) 200 MG tablet Take 400 mg by mouth every 6 (six) hours as needed.   Yes [provider]  lactulose (CHRONULAC) 10 GM/15ML solution Take 30 mLs by mouth 3 (three) times daily. 01/30/18  Yes [provider]  LINZESS 290 MCG CAPS capsule Take 290 mcg by mouth daily. 10/02/18  Yes [provider]  lisinopril (PRINIVIL,ZESTRIL) 20 MG tablet Take 40 mg by mouth daily.   Yes [provider]  metFORMIN (GLUCOPHAGE) 1000 MG tablet Take 1,000 mg by mouth 2 (two) times daily. 10/02/18  Yes [provider]  omeprazole (PRILOSEC) 20 MG capsule Take 20 mg by mouth daily. 08/14/18  Yes [provider]    Physical Exam: Vitals:   10/06/18 2045 10/06/18 2100 10/06/18 2115 10/06/18 2249  BP: (!) 101/59 (!) 101/59 105/67 (!) 101/57  Pulse: (!) 116 (!) 128 (!) 118 (!) 125  Resp:   16 16  Temp:      TempSrc:      SpO2: 97% 97% 95% 97%  Weight:      Height:          Constitutional: NAD, calm, comfortable Vitals:   10/06/18 2045 10/06/18 2100 10/06/18 2115 10/06/18 2249  BP: (!) 101/59 (!) 101/59 105/67 (!) 101/57  Pulse: (!) 116 Marland Kitchen(!)  128 (!) 118 (!) 125  Resp:   16 16  Temp:      TempSrc:      SpO2: 97% 97% 95% 97%  Weight:      Height:       Eyes: PERRL, lids and conjunctivae normal ENMT: Mucous membranes are moist. Posterior pharynx clear of any exudate or lesions.Normal dentition.  Neck: normal, supple, no masses, no thyromegaly Respiratory: clear to auscultation bilaterally, no wheezing, no crackles. Normal respiratory effort. No accessory muscle use.  Cardiovascular: Regular rate and rhythm, no murmurs / rubs / gallops. No extremity edema. 2+ pedal pulses. No carotid bruits.  Abdomen: no tenderness, no masses palpated. No hepatosplenomegaly. Bowel sounds positive.  Musculoskeletal: no clubbing / cyanosis. No  joint deformity upper and lower extremities. Good ROM, no contractures. Normal muscle tone.  Skin: no rashes, lesions, ulcers. No induration Neurologic: CN 2-12 grossly intact. Sensation intact, DTR normal. Strength 5/5 in all 4.  Psychiatric: Normal judgment and insight. Alert and oriented x 3. Normal mood.    Labs on Admission: I have personally reviewed following labs and imaging studies  CBC: Recent Labs  Lab 10/06/18 2017  WBC 6.9  NEUTROABS 4.1  HGB 9.7*  HCT 31.6*  MCV 90.0  PLT 92*   Basic Metabolic Panel: Recent Labs  Lab 10/06/18 2017  NA 136  K 3.9  CL 102  CO2 25  GLUCOSE 142*  BUN 14  CREATININE 0.57  CALCIUM 8.5*   GFR: Estimated Creatinine Clearance: 92.6 mL/min (by C-G formula based on SCr of 0.57 mg/dL). Liver Function Tests: Recent Labs  Lab 10/06/18 2017  AST 33  ALT 25  ALKPHOS 54  BILITOT 0.9  PROT 7.0  ALBUMIN 2.8*   Recent Labs  Lab 10/06/18 2017  LIPASE 27   Recent Labs  Lab 10/06/18 2017  AMMONIA 58*   Coagulation Profile: Recent Labs  Lab 10/06/18 2017  INR 1.44   Cardiac Enzymes: No results for input(s): CKTOTAL, CKMB, CKMBINDEX, TROPONINI in the last 168 hours. BNP (last 3 results) No results for input(s): PROBNP in the last 8760 hours. HbA1C: No results for input(s): HGBA1C in the last 72 hours. CBG: No results for input(s): GLUCAP in the last 168 hours. Lipid Profile: No results for input(s): CHOL, HDL, LDLCALC, TRIG, CHOLHDL, LDLDIRECT in the last 72 hours. Thyroid Function Tests: No results for input(s): TSH, T4TOTAL, FREET4, T3FREE, THYROIDAB in the last 72 hours. Anemia Panel: No results for input(s): VITAMINB12, FOLATE, FERRITIN, TIBC, IRON, RETICCTPCT in the last 72 hours. Urine analysis: No results found for: COLORURINE, APPEARANCEUR, LABSPEC, PHURINE, GLUCOSEU, HGBUR, BILIRUBINUR, KETONESUR, PROTEINUR, UROBILINOGEN, NITRITE, LEUKOCYTESUR Sepsis Labs:  !!!!!!!!!!!!!!!!!!!!!!!!!!!!!!!!!!!!!!!!!!!! @LABRCNTIP (procalcitonin:4,lacticidven:4) )No results found for this or any previous visit (from the past 240 hour(s)).   Radiological Exams on Admission: Dg Chest 2 View  Result Date: 10/06/2018 CLINICAL DATA:  48 year old female with vomiting. EXAM: CHEST - 2 VIEW COMPARISON:  Chest radiograph dated 10/28/2017 FINDINGS: The heart size and mediastinal contours are within normal limits. Both lungs are clear. The visualized skeletal structures are unremarkable. IMPRESSION: No active cardiopulmonary disease. Electronically Signed   By: Elgie CollardArash  Radparvar M.D.   On: 10/06/2018 20:42    EKG: Pending Case discussed with Dr. Clarice PolePfeifer in the ED Old chart reviewed Chest x-ray reviewed no edema or infiltrate  Assessment/Plan 48 year old female with acute upper GI bleed Principal Problem:   GIB (gastrointestinal bleeding)-we will proceed with stabilizing the patient.  Patient's vitals are concerning with tachycardia with soft blood pressure.  She has been given Zofran in the ED has not had any further vomiting blood since arrival.  Will start on octreotide drip in the setting of history of varices.  We will give a liter of IV fluid bolus right now.  We will repeat a hemoglobin in about an hour.  Transfuse if her hemoglobin drops below 8 or she becomes more unstable and does not respond to IV fluids.  Placed on octreotide and Protonix drips.  Dr. Dulce Sellar with GI has been called will see patient in the morning.  Admit to stepdown unit.  Keep n.p.o. at this time.  Active Problems:   Hepatitis C-noted    COPD (chronic obstructive pulmonary disease) (HCC)-stable and compensated at this time    Cirrhosis (HCC)-noted currently compensated    Hypertension-holding all medication at this time secondary to hypotension as above    Diabetes mellitus without complication (HCC)-stable    Varices, esophageal (HCC)-octreotide drip as above    DVT prophylaxis: SCDs  only Code Status: Full Family Communication: None Disposition Plan: 1 to 3 days Consults called: GI Dr. Dulce Sellar Admission status: Admission   Gershom Brobeck A MD [facility]  If 7PM-7AM, please contact night-coverage [email or web address] Password [email or web address]  10/06/2018, 11:20 PM

## 2018-10-06 NOTE — ED Notes (Signed)
Patient transported to X-ray 

## 2018-10-06 NOTE — ED Triage Notes (Signed)
Pt brought in by EMS from home for x5-6 episodes of bloody stool since midnight last night and dark red emesis since 1500 today. Pt states she has a hx of GI bleeds, the last one being in July of 2019. Pt endorses RUQ abdominal pain with rebound tenderness. Pt also c/o nausea, given 4mg  zofran PTA with relief. Pt A+Ox4, skin warm and dry.

## 2018-10-06 NOTE — ED Provider Notes (Signed)
[facility] EMERGENCY DEPARTMENT Provider Note   CSN: [id number] Arrival date & time: 10/06/18  1843     History   Chief Complaint Chief Complaint  Patient presents with  . Abdominal Pain  . GI Bleeding    HPI Erica Booth is a 48 y.o. female.  HPI Patient has prior history of upper GI bleed.  Review of EMR indicates that this was likely due to a Mallory-Weiss tear.  Patient had grade 1 varices.  She has history of hepatitis C with cure with Harvoni treatment.  She does however have chronic cirrhosis.  Patient reports that she ate gummy bears yesterday and then in the middle night she woke up and vomited and it looked a little bit red but she thought it was just a gummy bears.  She reports today however she has had 3 more episodes of vomiting and there have been episodes where it was bright red and somewhere it was more dark and coffee-ground looking.  She reports she is mostly having dry heaves.  She reports she is also started having bowel movements that are greenish and blackish looking.  She reports she does have abdominal pain that is mostly in the right upper quadrant.  Is somewhat chronic.  She does not report that her abdominal pain got significantly or severely worse than typical.  No syncopal episodes. Past Medical History:  Diagnosis Date  . Acid reflux   . Anemia   . Cirrhosis (HCC)   . COPD (chronic obstructive pulmonary disease) (HCC)   . Diabetes mellitus without complication (HCC)   . Gastroparesis   . Hepatitis C   . Hypertension     Patient Active Problem List   Diagnosis Date Noted  . GIB (gastrointestinal bleeding) 10/06/2018  . Varices, esophageal (HCC) 10/06/2018  . Hepatitis C   . COPD (chronic obstructive pulmonary disease) (HCC)   . Cirrhosis (HCC)   . Hypertension   . Diabetes mellitus without complication [facility])     Past Surgical History:  Procedure Laterality Date  . ABDOMINAL HYSTERECTOMY    . APPENDECTOMY    . EYE  SURGERY    . KNEE REPAIR EXTENSOR MECHANISM       OB History   No obstetric history on file.      Home Medications    Prior to Admission medications   Medication Sig Start Date End Date Taking? Authorizing Provider  ibuprofen (ADVIL,MOTRIN) 200 MG tablet Take 400 mg by mouth every 6 (six) hours as needed.   Yes [provider]  lactulose (CHRONULAC) 10 GM/15ML solution Take 30 mLs by mouth 3 (three) times daily. 01/30/18  Yes [provider]  LINZESS 290 MCG CAPS capsule Take 290 mcg by mouth daily. 10/02/18  Yes [provider]  lisinopril (PRINIVIL,ZESTRIL) 20 MG tablet Take 40 mg by mouth daily.   Yes [provider]  metFORMIN (GLUCOPHAGE) 1000 MG tablet Take 1,000 mg by mouth 2 (two) times daily. 10/02/18  Yes [provider]  omeprazole (PRILOSEC) 20 MG capsule Take 20 mg by mouth daily. 08/14/18  Yes [provider]    Family History No family history on file.  Social History Social History   Tobacco Use  . Smoking status: Current Every Day Smoker    Packs/day: 0.50    Years: 33.00    Pack years: 16.50    Types: Cigarettes  . Smokeless tobacco: Never Used  Substance Use Topics  . Alcohol use: Never  Frequency: Never  . Drug use: Yes    Frequency: 2.0 times per week    Types: Marijuana     Allergies   Amitriptyline; Ondansetron; and Prednisone   Review of Systems Review of Systems 10 Systems reviewed and are negative for acute change except as noted in the HPI.   Physical Exam Updated Vital Signs BP 97/64   Pulse (!) 122   Temp 98.6 F (37 C) (Oral)   Resp 17   Ht 5\' 6"  (1.676 m)   Wt 81.6 kg   SpO2 96%   BMI 29.05 kg/m   Physical Exam Constitutional:      Comments: Patient is alert and appropriate.  Nontoxic in appearance.  No respiratory distress.  HENT:     Head: Normocephalic and atraumatic.     Nose: Nose normal.     Mouth/Throat:     Mouth: Mucous membranes are moist.      Comments: Mucous membranes are pink and moist.  Posterior oropharynx is widely patent.  No blood in the nasopharynx or oropharynx. Eyes:     Extraocular Movements: Extraocular movements intact.     Conjunctiva/sclera: Conjunctivae normal.     Pupils: Pupils are equal, round, and reactive to light.  Cardiovascular:     Pulses: Normal pulses.     Comments: Tachycardia.  No gross rub murmur gallop. Pulmonary:     Effort: Pulmonary effort is normal.     Comments: Breath sounds are soft.  Patient has airflow to lower mid lung fields.  Occasional expiratory wheeze.  No respiratory distress. Abdominal:     Comments: Abdomen soft.  Patient with tenderness in the epigastrium and right upper quadrant.  No guarding.  Lower abdomen soft and nontender.  Musculoskeletal: Normal range of motion.     Comments: No lower extremity edema.  Calves are soft and nontender.  Skin:    Comments: Skin warm and dry.  Patient does have scattered telangiectasias of her upper body.  Neurological:     General: No focal deficit present.     Mental Status: She is oriented to person, place, and time.     Motor: No weakness.     Coordination: Coordination normal.  Psychiatric:        Mood and Affect: Mood normal.      ED Treatments / Results  Labs (all labs ordered are listed, but only abnormal results are displayed) Labs Reviewed  COMPREHENSIVE METABOLIC PANEL - Abnormal; Notable for the following components:      Result Value   Glucose, Bld 142 (*)    Calcium 8.5 (*)    Albumin 2.8 (*)    All other components within normal limits  CBC WITH DIFFERENTIAL/PLATELET - Abnormal; Notable for the following components:   RBC 3.51 (*)    Hemoglobin 9.7 (*)    HCT 31.6 (*)    RDW 20.5 (*)    Platelets 92 (*)    All other components within normal limits  PROTIME-INR - Abnormal; Notable for the following components:   Prothrombin Time 17.4 (*)    All other components within normal limits  AMMONIA - Abnormal;  Notable for the following components:   Ammonia 58 (*)    All other components within normal limits  POC OCCULT BLOOD, ED - Abnormal; Notable for the following components:   Fecal Occult Bld POSITIVE (*)    All other components within normal limits  LIPASE, BLOOD  BASIC METABOLIC PANEL  CBC  HEMOGLOBIN  I-STAT BETA HCG  BLOOD, ED ([facility], [facility], [facility] ONLY)  TYPE AND SCREEN  ABO/RH    EKG EKG Interpretation  Date/Time:  Sunday October 06 2018 23:24:13 EST Ventricular Rate:  118 PR Interval:    QRS Duration: 89 QT Interval:  334 QTC Calculation: 468 R Axis:   61 Text Interpretation:  Sinus tachycardia agree. no ld comparison Confirmed by Arby BarrettePfeiffer, Castor Gittleman [phone]) on 10/06/2018 11:44:04 PM   Radiology Dg Chest 2 View  Result Date: 10/06/2018 CLINICAL DATA:  48 year old female with vomiting. EXAM: CHEST - 2 VIEW COMPARISON:  Chest radiograph dated 10/28/2017 FINDINGS: The heart size and mediastinal contours are within normal limits. Both lungs are clear. The visualized skeletal structures are unremarkable. IMPRESSION: No active cardiopulmonary disease. Electronically Signed   By: Elgie CollardArash  Radparvar M.D.   On: 10/06/2018 20:42    Procedures Procedures (including critical care time) CRITICAL CARE Performed by: Arby BarretteMarcy Brick Ketcher   Total critical care time: 30 minutes  Critical care time was exclusive of separately billable procedures and treating other patients.  Critical care was necessary to treat or prevent imminent or life-threatening deterioration.  Critical care was time spent personally by me on the following activities: development of treatment plan with patient and/or surrogate as well as nursing, discussions with consultants, evaluation of patient's response to treatment, examination of patient, obtaining history from patient or surrogate, ordering and performing treatments and interventions, ordering and review of laboratory studies, ordering and review of radiographic studies, pulse  oximetry and re-evaluation of patient's condition. Medications Ordered in ED Medications  pantoprazole (PROTONIX) 80 mg in sodium chloride 0.9 % 250 mL (0.32 mg/mL) infusion (8 mg/hr Intravenous New Bag/Given 10/06/18 2012)  sodium chloride 0.9 % bolus 1,000 mL (has no administration in time range)  octreotide (SANDOSTATIN) 2 mcg/mL load via infusion 25 mcg (has no administration in time range)    And  octreotide (SANDOSTATIN) 500 mcg in sodium chloride 0.9 % 250 mL (2 mcg/mL) infusion (has no administration in time range)  ondansetron (ZOFRAN) tablet 4 mg (has no administration in time range)    Or  ondansetron (ZOFRAN) injection 4 mg (has no administration in time range)  pantoprazole (PROTONIX) 80 mg in sodium chloride 0.9 % 100 mL IVPB (0 mg Intravenous Stopped 10/06/18 2126)  ondansetron (ZOFRAN) injection 4 mg (4 mg Intravenous Given 10/06/18 2011)     Initial Impression / Assessment and Plan / ED Course  I have reviewed the triage vital signs and the nursing notes.  Pertinent labs & imaging results that were available during my care of the patient were reviewed by me and considered in my medical decision making (see chart for details).  Clinical Course as of Oct 06 2342  Wynelle LinkSun Oct 06, 2018  2259 Consult: Reviewed with Dr. Dulce Sellarutlaw.  Reviewed HPI and patient history.   [MP]  2300 Patient reports she feels improved since having Zofran and Protonix.  She denies any pain.  She has not had any vomiting.   [MP]    Clinical Course User Index [MP] Arby BarrettePfeiffer, Jb Dulworth, MD   Patient is alert and in no acute distress however she is tachycardic with soft blood pressures. GI has been consulted.  She has had no further vomiting in the emergency department since arrival.  Patient  treated with Zofran and Protonix.  She feels comfortable at this time.  Abdominal examination is soft without guarding.  She is not distended.  Patient does have known history of Mallory-Weiss tear and grade 1 esophageal  varices.  Patient does  test heme positive.  Findings are consistent with upper GI bleed.  Patient will be admitted for ongoing monitoring and diagnostic studies as needed.  Final Clinical Impressions(s) / ED Diagnoses   Final diagnoses:  Vomiting  Upper GI bleed  Severe comorbid illness    ED Discharge Orders    None       Arby Barrette, MD 10/06/18 2349

## 2018-10-07 DIAGNOSIS — E119 Type 2 diabetes mellitus without complications: Secondary | ICD-10-CM

## 2018-10-07 DIAGNOSIS — B182 Chronic viral hepatitis C: Secondary | ICD-10-CM

## 2018-10-07 DIAGNOSIS — K7460 Unspecified cirrhosis of liver: Principal | ICD-10-CM

## 2018-10-07 DIAGNOSIS — K920 Hematemesis: Secondary | ICD-10-CM

## 2018-10-07 LAB — CBC
HCT: 29.6 % — ABNORMAL LOW (ref 36.0–46.0)
HCT: 27.5 % — ABNORMAL LOW (ref 36.0–46.0)
Hemoglobin: 9.2 g/dL — ABNORMAL LOW (ref 12.0–15.0)
Hemoglobin: 8.6 g/dL — ABNORMAL LOW (ref 12.0–15.0)
MCH: 27.8 pg (ref 26.0–34.0)
MCH: 28.3 pg (ref 26.0–34.0)
MCHC: 31.1 g/dL (ref 30.0–36.0)
MCHC: 31.3 g/dL (ref 30.0–36.0)
MCV: 89.4 fL (ref 80.0–100.0)
MCV: 90.5 fL (ref 80.0–100.0)
Platelets: 86 10*3/uL — ABNORMAL LOW (ref 150–400)
Platelets: 84 10*3/uL — ABNORMAL LOW (ref 150–400)
RBC: 3.31 MIL/uL — ABNORMAL LOW (ref 3.87–5.11)
RBC: 3.04 MIL/uL — ABNORMAL LOW (ref 3.87–5.11)
RDW: 20.4 % — AB (ref 11.5–15.5)
RDW: 20.5 % — ABNORMAL HIGH (ref 11.5–15.5)
WBC: 7.2 10*3/uL (ref 4.0–10.5)
WBC: 6.2 10*3/uL (ref 4.0–10.5)
nRBC: 0 % (ref 0.0–0.2)
nRBC: 0 % (ref 0.0–0.2)

## 2018-10-07 LAB — ABO/RH: ABO/RH(D): AB POS

## 2018-10-07 LAB — BASIC METABOLIC PANEL
Anion gap: 9 (ref 5–15)
BUN: 16 mg/dL (ref 6–20)
CHLORIDE: 109 mmol/L (ref 98–111)
CO2: 23 mmol/L (ref 22–32)
Calcium: 8.3 mg/dL — ABNORMAL LOW (ref 8.9–10.3)
Creatinine, Ser: 0.61 mg/dL (ref 0.44–1.00)
GFR calc Af Amer: >60 mL/min (ref >60)
GFR calc non Af Amer: >60 mL/min (ref >60)
Glucose, Bld: 125 mg/dL — ABNORMAL HIGH (ref 70–99)
Potassium: 3.7 mmol/L (ref 3.5–5.1)
Sodium: 141 mmol/L (ref 135–145)

## 2018-10-07 LAB — MAGNESIUM: Magnesium: 1.3 mg/dL — ABNORMAL LOW (ref 1.7–2.4)

## 2018-10-07 LAB — HEMOGLOBIN AND HEMATOCRIT, BLOOD
HCT: 31.1 % — ABNORMAL LOW (ref 36.0–46.0)
HCT: 29.8 % — ABNORMAL LOW (ref 36.0–46.0)
HCT: 25.6 % — ABNORMAL LOW (ref 36.0–46.0)
Hemoglobin: 9.6 g/dL — ABNORMAL LOW (ref 12.0–15.0)
Hemoglobin: 9.3 g/dL — ABNORMAL LOW (ref 12.0–15.0)
Hemoglobin: 8.2 g/dL — ABNORMAL LOW (ref 12.0–15.0)

## 2018-10-07 LAB — PREPARE RBC (CROSSMATCH)

## 2018-10-07 LAB — PROTIME-INR
INR: 1.43
Prothrombin Time: 17.2 seconds — ABNORMAL HIGH (ref 11.4–15.2)

## 2018-10-07 LAB — GLUCOSE, CAPILLARY
Glucose-Capillary: 129 mg/dL — ABNORMAL HIGH (ref 70–99)
Glucose-Capillary: 109 mg/dL — ABNORMAL HIGH (ref 70–99)

## 2018-10-07 LAB — HEMOGLOBIN A1C
Hgb A1c MFr Bld: 6.3 % — ABNORMAL HIGH (ref 4.8–5.6)
Mean Plasma Glucose: 134.11 mg/dL

## 2018-10-07 LAB — MRSA PCR SCREENING: MRSA by PCR: NEGATIVE

## 2018-10-07 MED ORDER — MORPHINE SULFATE (PF) 2 MG/ML IV SOLN
2.0000 mg | INTRAVENOUS | Status: DC | PRN
  Administered 2018-10-07 – 2018-10-09 (×7): 2 mg via INTRAVENOUS
  Filled 2018-10-07 (×7): qty 1

## 2018-10-07 MED ORDER — HYDRALAZINE HCL 20 MG/ML IJ SOLN: 10.0000 mg | Freq: Four times a day (QID) | INTRAMUSCULAR | Status: DC | PRN

## 2018-10-07 MED ORDER — SODIUM CHLORIDE 0.9% IV SOLUTION: Freq: Once | INTRAVENOUS | Status: DC

## 2018-10-07 MED ORDER — INSULIN ASPART 100 UNIT/ML ~~LOC~~ SOLN
0.0000 [IU] | Freq: Three times a day (TID) | SUBCUTANEOUS | Status: DC
  Administered 2018-10-07 – 2018-10-09 (×3): 1 [IU] via SUBCUTANEOUS

## 2018-10-07 MED ORDER — LACTATED RINGERS IV SOLN
INTRAVENOUS | Status: AC
  Administered 2018-10-07: 11:00:00 via INTRAVENOUS

## 2018-10-07 MED ORDER — PROMETHAZINE HCL 25 MG/ML IJ SOLN
12.5000 mg | Freq: Once | INTRAMUSCULAR | Status: AC
  Administered 2018-10-07: 12.5 mg via INTRAVENOUS
  Filled 2018-10-07: qty 1

## 2018-10-07 MED ORDER — SODIUM CHLORIDE 0.9 % IV SOLN: 1.0000 g | INTRAVENOUS | Status: DC

## 2018-10-07 MED ORDER — SODIUM CHLORIDE 0.9 % IV SOLN
2.0000 g | INTRAVENOUS | Status: DC
  Administered 2018-10-07: 2 g via INTRAVENOUS
  Filled 2018-10-07 (×3): qty 20

## 2018-10-07 NOTE — Progress Notes (Signed)
Danielle RankinMary Jo Vandeusen is a 48 y.o. female patient admitted from ED awake, alert - oriented  X 4 - no acute distress noted.  VSS - Blood pressure (!) 102/56, pulse (!) 119, temperature 98.3 F (36.8 C), temperature source Oral, resp. rate 13, height 5\' 6"  (1.676 m), weight 81.6 kg, SpO2 95 %.    IV in place, occlusive dsg intact without redness.  Orientation to room, and floor completed with information packet given to patient/family.  Patient declined safety video at this time.  Admission INP armband ID verified with patient/family, and in place.   SR up x 2, fall assessment complete, with patient and family able to verbalize understanding of risk associated with falls, and verbalized understanding to call nsg before up out of bed.  Call light within reach, patient able to voice, and demonstrate understanding.  No evidence of skin break down noted on exam.     Will cont to eval and treat per MD orders.  Dierdre ForthCandace H Kenza Munar, RN 10/07/2018 4:42 AM

## 2018-10-07 NOTE — Progress Notes (Signed)
Pt BP dropped to 91/60. Pt is lethargic and oriented x4. MD made aware. Will continue to monitor and treat per MD orders.

## 2018-10-07 NOTE — Consult Note (Addendum)
[facility]      Dr. Elnoria Howard and Dr. Loreta Ave  History: Ms. Fruehauf is a 48 y.o f patient of Dr. Venida Jarvis at [facility] with chronic hepatitis c genotype 1A s/p Harvoni tx 07/28/17-10/19/17, cirrhosis with esophageal varices, copd, diabetes c/b gastroparesis, and hypertension who presented 10/06/18 with abdominal pain and hematemesis. She states that she has been having epigastric and ruq abd pain for the past week. She stated that she vomited 4 times on Saturday 10/05/18. She describes that there was "almost a gallon of blood" that she saw in her vomit. She reports some dizziness over the past week and stumbling once but no falls.  Patient's last EGD seen on records was in Oct 2018 showed grade I esophageal varices and portal htn gastropathy with normal duodenum. Patient's bp ranging 90-100s/50-60s over the past 24 hrs. Her husband reports that she had an EGD done Summer of 2019 at [facility], but I do not see this on Care everywhere.   The patient has been placed on a protonix and ocrtreotide drip, and received a cumulative total of approx 2L of IVF to maintain pressure since admission.Hb has remained stable (8.6>9.2>9.7) after receiving 1u prbc today morning 10/07/18.    ROS  Review of Systems  Constitutional: Negative for chills and fever.  Respiratory: Negative for shortness of breath.   Cardiovascular: Negative for chest pain and palpitations.  Gastrointestinal: Positive for abdominal pain, blood in stool and vomiting.  Neurological: Positive for dizziness.  Psychiatric/Behavioral: Memory loss:    Physical Exam: Blood pressure 91/60, pulse 89, temperature 98.3 F (36.8 C), temperature source Oral, resp. rate 14, height 5\' 6"  (1.676 m), weight 81.5 kg, SpO2 93 %.   Physical Exam  Constitutional: She appears lethargic. She appears ill.  HENT:  Head: Normocephalic and atraumatic.  Eyes: Conjunctivae are normal.  Cardiovascular: Normal rate, regular rhythm and normal heart  sounds.  Respiratory: Effort normal and breath sounds normal. No respiratory distress. She has no wheezes.  GI: Soft. Bowel sounds are normal. She exhibits no distension. There is abdominal tenderness (epigastric, ruq, and rlq to palpation).  Musculoskeletal:        General: No edema.  Neurological: She appears lethargic.  Psychiatric: She has a normal mood and affect. Her behavior is normal. Judgment and thought content normal.   Assessment & Plan by Problem: Principal Problem:   GIB (gastrointestinal bleeding) Active Problems:   Hepatitis C   COPD (chronic obstructive pulmonary disease) (HCC)   Cirrhosis (HCC)   Hypertension   Diabetes mellitus without complication (HCC)   Varices, esophageal (HCC)  Gastrointestinal bleeding  Esophageal varices Patient's bleed is likely secondary to esophageal varices. She has a glasgow-blatchford bleeding score of 11 causing her to be a high risk gi bleed. Will do EGD at around 1500 10/07/18. Please keep NPO.  -monitor for ams, massive hematemesis, and risk of aspiration. If she develops contact for possible need for endotracheal intubation -continue octreotide drip  -continue protonix drip -continue ceftriaxone -Administer maintenance IVF with LR 169ml/hr for 10 hrs (last echo in 2018 showed ef 55-60%) -Maintain 2 IV access-monitor hb&hct q8hrs, transfues goal hb<8 -Follow up with GI outpatient   Hepatitis C with cirrhosis  Fibroscan done September 2018 showed stage F4 cirrhosis. MRI of abd done Nov 2019 showed hepatic cirrhosis with features of portal htn (splenomegally measuring 15.2cm craniocaudal, upper abd ascites, right and left anterior perihepatic ascites, and small caliber upper abd varices) w/o worrisome hepatic lesions.  Patient is immune to HAV, has had hep b vaccine 07/10/17,10/22/17, twinrix 01/30/18.   Patient also likely has a component of NASH.  SignedLorenso Courier, MD 10/07/2018, 9:17 AM  Pager: [phone]

## 2018-10-07 NOTE — Progress Notes (Signed)
PROGRESS NOTE                                                                                                                                                                                                             Patient Demographics:    Erica Booth, is a 48 y.o. female, DOB - 10/05/1970, ZOX:[id number]  Admit date - 10/06/2018   Admitting Physician Haydee Monicaachal A David, MD  Outpatient Primary MD for the patient is Patient, No Pcp Per  LOS - 1  Chief Complaint  Patient presents with  . Abdominal Pain  . GI Bleeding       Brief Narrative  Erica Booth is a 48 y.o. female with medical history significant of hepatitis C, cirrhosis of the liver with a history of esophageal varices, COPD, diabetes comes in with acute onset of vomiting coffee-ground emesis with some bright red streaking that started several hours ago, she was admitted for Acute UGI bleed.   Subjective:    Erica RifeMary Booth today has, No headache, No chest pain, No abdominal pain - No Nausea, No new weakness tingling or numbness, No Cough - SOB.     Assessment  & Plan :     1. Acute Symptomatic UGI bleed -in a patient with hep C cirrhosis, portal hypertension and history of esophageal varices.  Currently on IV PPI and octreotide drip, H&H stable, since she was symptomatic she is getting 1 unit of packed RBC transfusion on 10/07/2018, GI has been consulted they are contemplating EGD today.  Will continue to monitor.  2.  Hep C related cirrhosis and portal hypertension.  Supportive care for now.  Start her on Rocephin for SBP prophylaxis.  3.  Essential hypertension.  For now PRN hydralazine.  4.  DM type II.  Check A1c and place on sliding scale.    Family Communication  :  None  Code Status :  Full  Disposition Plan  :  TBD  Consults  :  GI - Mann  Procedures  :      DVT Prophylaxis  :  SCDs    Lab Results  Component Value Date   PLT 84 (L) 10/07/2018    Diet :  Diet Order            Diet  NPO time specified Except for: Sips with Meds  Diet effective now               Inpatient Medications Scheduled Meds: . sodium chloride  Intravenous Once   Continuous Infusions: . octreotide  (SANDOSTATIN)    IV infusion 50 mcg/hr (10/07/18 0151)  . pantoprozole (PROTONIX) infusion 8 mg/hr (10/07/18 0626)   PRN Meds:.morphine injection, ondansetron **OR** ondansetron (ZOFRAN) IV  Antibiotics  :   Anti-infectives (From admission, onward)   None          Objective:   Vitals:   10/07/18 0605 10/07/18 0620 10/07/18 0635 10/07/18 0920  BP: 100/65 94/60 91/60  100/69  Pulse: 97 93 89 89  Resp: 14 13 14 13   Temp: 98 F (36.7 C)  98.3 F (36.8 C) 98.3 F (36.8 C)  TempSrc: Oral  Oral Oral  SpO2: 93%  93% 95%  Weight:   81.5 kg   Height:        Wt Readings from Last 3 Encounters:  10/07/18 81.5 kg     Intake/Output Summary (Last 24 hours) at 10/07/2018 0927 Last data filed at 10/07/2018 0920 Gross per 24 hour  Intake 1961.42 ml  Output 150 ml  Net 1811.42 ml     Physical Exam  Awake Alert, Oriented X 3, No new F.N deficits, Normal affect [place].AT,PERRAL Supple Neck,No JVD, No cervical lymphadenopathy appriciated.  Symmetrical Chest wall movement, Good air movement bilaterally, CTAB RRR,No Gallops,Rubs or new Murmurs, No Parasternal Heave +ve B.Sounds, Abd Soft, No tenderness, No organomegaly appriciated, No rebound - guarding or rigidity. No Cyanosis, Clubbing or edema, No new Rash or bruise       Data Review:    CBC Recent Labs  Lab 10/06/18 2017 10/07/18 0103 10/07/18 0401  WBC 6.9 7.2 6.2  HGB 9.7* 9.2* 8.6*  HCT 31.6* 29.6* 27.5*  PLT 92* 86* 84*  MCV 90.0 89.4 90.5  MCH 27.6 27.8 28.3  MCHC 30.7 31.1 31.3  RDW 20.5* 20.4* 20.5*  LYMPHSABS 2.0  --   --   MONOABS 0.6  --   --   EOSABS 0.2  --   --   BASOSABS 0.0  --   --     Chemistries  Recent Labs  Lab 10/06/18 2017 10/07/18 0103  NA 136 141  K 3.9 3.7  CL 102 109  CO2 25 23    GLUCOSE 142* 125*  BUN 14 16  CREATININE 0.57 0.61  CALCIUM 8.5* 8.3*  AST 33  --   ALT 25  --   ALKPHOS 54  --   BILITOT 0.9  --    ------------------------------------------------------------------------------------------------------------------ No results for input(s): CHOL, HDL, LDLCALC, TRIG, CHOLHDL, LDLDIRECT in the last 72 hours.  No results found for: HGBA1C ------------------------------------------------------------------------------------------------------------------ No results for input(s): TSH, T4TOTAL, T3FREE, THYROIDAB in the last 72 hours.  Invalid input(s): FREET3 ------------------------------------------------------------------------------------------------------------------ No results for input(s): VITAMINB12, FOLATE, FERRITIN, TIBC, IRON, RETICCTPCT in the last 72 hours.  Coagulation profile Recent Labs  Lab 10/06/18 2017  INR 1.44    No results for input(s): DDIMER in the last 72 hours.  Cardiac Enzymes No results for input(s): CKMB, TROPONINI, MYOGLOBIN in the last 168 hours.  Invalid input(s): CK ------------------------------------------------------------------------------------------------------------------ No results found for: BNP  Micro Results Recent Results (from the past 240 hour(s))  MRSA PCR Screening     Status: None   Collection Time: 10/07/18  1:02 AM  Result Value Ref Range Status   MRSA by PCR NEGATIVE NEGATIVE Final    Comment:        The GeneXpert MRSA Assay (FDA approved for NASAL specimens only), is one component of a comprehensive MRSA colonization surveillance program. It is  not intended to diagnose MRSA infection nor to guide or monitor treatment for MRSA infections. Performed at [facility], [address] [place], [place] [address]     Radiology Reports Dg Chest 2 View  Result Date: 10/06/2018 CLINICAL DATA:  48 year old female with vomiting. EXAM: CHEST - 2 VIEW COMPARISON:  Chest radiograph dated  10/28/2017 FINDINGS: The heart size and mediastinal contours are within normal limits. Both lungs are clear. The visualized skeletal structures are unremarkable. IMPRESSION: No active cardiopulmonary disease. Electronically Signed   By: Elgie Collard M.D.   On: 10/06/2018 20:42    Time Spent in minutes  30   Susa Raring M.D on 10/07/2018 at 9:27 AM  To page go to [email or web address] - password [facility]

## 2018-10-07 NOTE — Progress Notes (Signed)
Pt vomited bright red, coffee ground emesis 3 times. VSS. 1x dose of phenegran given. MD paged. STAT CBC ordered. Will continue to monitor.

## 2018-10-07 NOTE — Plan of Care (Signed)

## 2018-10-07 NOTE — H&P (View-Only) (Signed)
[facility]      Dr. Elnoria Howard and Dr. Loreta Ave  History: Ms. Erica Booth is a 48 y.o f patient of Dr. Venida Jarvis at [facility] with chronic hepatitis c genotype 1A s/p Harvoni tx 07/28/17-10/19/17, cirrhosis with esophageal varices, copd, diabetes c/b gastroparesis, and hypertension who presented 10/06/18 with abdominal pain and hematemesis. She states that she has been having epigastric and ruq abd pain for the past week. She stated that she vomited 4 times on Saturday 10/05/18. She describes that there was "almost a gallon of blood" that she saw in her vomit. She reports some dizziness over the past week and stumbling once but no falls.  Patient's last EGD seen on records was in Oct 2018 showed grade I esophageal varices and portal htn gastropathy with normal duodenum. Patient's bp ranging 90-100s/50-60s over the past 24 hrs. Her husband reports that she had an EGD done Summer of 2019 at [facility], but I do not see this on Care everywhere.   The patient has been placed on a protonix and ocrtreotide drip, and received a cumulative total of approx 2L of IVF to maintain pressure since admission.Hb has remained stable (8.6>9.2>9.7) after receiving 1u prbc today morning 10/07/18.    ROS  Review of Systems  Constitutional: Negative for chills and fever.  Respiratory: Negative for shortness of breath.   Cardiovascular: Negative for chest pain and palpitations.  Gastrointestinal: Positive for abdominal pain, blood in stool and vomiting.  Neurological: Positive for dizziness.  Psychiatric/Behavioral: Memory loss:    Physical Exam: Blood pressure 91/60, pulse 89, temperature 98.3 F (36.8 C), temperature source Oral, resp. rate 14, height 5\' 6"  (1.676 m), weight 81.5 kg, SpO2 93 %.   Physical Exam  Constitutional: She appears lethargic. She appears ill.  HENT:  Head: Normocephalic and atraumatic.  Eyes: Conjunctivae are normal.  Cardiovascular: Normal rate, regular rhythm and normal heart  sounds.  Respiratory: Effort normal and breath sounds normal. No respiratory distress. She has no wheezes.  GI: Soft. Bowel sounds are normal. She exhibits no distension. There is abdominal tenderness (epigastric, ruq, and rlq to palpation).  Musculoskeletal:        General: No edema.  Neurological: She appears lethargic.  Psychiatric: She has a normal mood and affect. Her behavior is normal. Judgment and thought content normal.   Assessment & Plan by Problem: Principal Problem:   GIB (gastrointestinal bleeding) Active Problems:   Hepatitis C   COPD (chronic obstructive pulmonary disease) (HCC)   Cirrhosis (HCC)   Hypertension   Diabetes mellitus without complication (HCC)   Varices, esophageal (HCC)  Gastrointestinal bleeding  Esophageal varices Patient's bleed is likely secondary to esophageal varices. She has a glasgow-blatchford bleeding score of 11 causing her to be a high risk gi bleed. Will do EGD at around 1500 10/07/18. Please keep NPO.  -monitor for ams, massive hematemesis, and risk of aspiration. If she develops contact for possible need for endotracheal intubation -continue octreotide drip  -continue protonix drip -continue ceftriaxone -Administer maintenance IVF with LR 169ml/hr for 10 hrs (last echo in 2018 showed ef 55-60%) -Maintain 2 IV access-monitor hb&hct q8hrs, transfues goal hb<8 -Follow up with GI outpatient   Hepatitis C with cirrhosis  Fibroscan done September 2018 showed stage F4 cirrhosis. MRI of abd done Nov 2019 showed hepatic cirrhosis with features of portal htn (splenomegally measuring 15.2cm craniocaudal, upper abd ascites, right and left anterior perihepatic ascites, and small caliber upper abd varices) w/o worrisome hepatic lesions.  Patient is immune to HAV, has had hep b vaccine 07/10/17,10/22/17, twinrix 01/30/18.   Patient also likely has a component of NASH.  Signed: Earlean Fidalgo, MD 10/07/2018, 9:17 AM  Pager: [phone]  

## 2018-10-08 ENCOUNTER — Inpatient Hospital Stay (HOSPITAL_COMMUNITY): Payer: Medicaid Other | Admitting: Certified Registered"

## 2018-10-08 ENCOUNTER — Encounter (HOSPITAL_COMMUNITY): Payer: Self-pay | Admitting: *Deleted

## 2018-10-08 ENCOUNTER — Encounter (HOSPITAL_COMMUNITY)
Admission: EM | Disposition: A | Discharge status: Home / Self Care | Payer: Self-pay | Source: Home / Self Care | Attending: Internal Medicine

## 2018-10-08 HISTORY — PX: ESOPHAGOGASTRODUODENOSCOPY (EGD) WITH PROPOFOL: SHX5813

## 2018-10-08 HISTORY — PX: ESOPHAGEAL BANDING: SHX5518

## 2018-10-08 LAB — COMPREHENSIVE METABOLIC PANEL
ALK PHOS: 46 U/L (ref 38–126)
ALT: 24 U/L (ref 0–44)
AST: 42 U/L — AB (ref 15–41)
Albumin: 2.5 g/dL — ABNORMAL LOW (ref 3.5–5.0)
Anion gap: 4 — ABNORMAL LOW (ref 5–15)
BUN: 10 mg/dL (ref 6–20)
CALCIUM: 8 mg/dL — AB (ref 8.9–10.3)
CO2: 25 mmol/L (ref 22–32)
Chloride: 110 mmol/L (ref 98–111)
Creatinine, Ser: 0.61 mg/dL (ref 0.44–1.00)
GFR calc Af Amer: >60 mL/min (ref >60)
GFR calc non Af Amer: >60 mL/min (ref >60)
Glucose, Bld: 96 mg/dL (ref 70–99)
Potassium: 3.7 mmol/L (ref 3.5–5.1)
Sodium: 139 mmol/L (ref 135–145)
Total Bilirubin: 0.8 mg/dL (ref 0.3–1.2)
Total Protein: 6.1 g/dL — ABNORMAL LOW (ref 6.5–8.1)

## 2018-10-08 LAB — TYPE AND SCREEN
ABO/RH(D): AB POS
Antibody Screen: NEGATIVE
Unit division: 0

## 2018-10-08 LAB — BPAM RBC
BLOOD PRODUCT EXPIRATION DATE: 202002022359
ISSUE DATE / TIME: 202001200612
Unit Type and Rh: 8400

## 2018-10-08 LAB — CBC
HCT: 25.9 % — ABNORMAL LOW (ref 36.0–46.0)
Hemoglobin: 8.2 g/dL — ABNORMAL LOW (ref 12.0–15.0)
MCH: 28.6 pg (ref 26.0–34.0)
MCHC: 31.7 g/dL (ref 30.0–36.0)
MCV: 90.2 fL (ref 80.0–100.0)
Platelets: 80 10*3/uL — ABNORMAL LOW (ref 150–400)
RBC: 2.87 MIL/uL — ABNORMAL LOW (ref 3.87–5.11)
RDW: 19.9 % — ABNORMAL HIGH (ref 11.5–15.5)
WBC: 3.7 10*3/uL — ABNORMAL LOW (ref 4.0–10.5)
nRBC: 0 % (ref 0.0–0.2)

## 2018-10-08 LAB — GLUCOSE, CAPILLARY
GLUCOSE-CAPILLARY: 135 mg/dL — AB (ref 70–99)
Glucose-Capillary: 113 mg/dL — ABNORMAL HIGH (ref 70–99)
Glucose-Capillary: 131 mg/dL — ABNORMAL HIGH (ref 70–99)
Glucose-Capillary: 117 mg/dL — ABNORMAL HIGH (ref 70–99)
Glucose-Capillary: 131 mg/dL — ABNORMAL HIGH (ref 70–99)

## 2018-10-08 LAB — HEMOGLOBIN AND HEMATOCRIT, BLOOD
HCT: 28.7 % — ABNORMAL LOW (ref 36.0–46.0)
Hemoglobin: 9 g/dL — ABNORMAL LOW (ref 12.0–15.0)

## 2018-10-08 SURGERY — ESOPHAGOGASTRODUODENOSCOPY (EGD) WITH PROPOFOL
Anesthesia: Monitor Anesthesia Care | Laterality: Left

## 2018-10-08 MED ORDER — SODIUM CHLORIDE 0.9 % IV SOLN: INTRAVENOUS | Status: DC

## 2018-10-08 MED ORDER — PROPOFOL 500 MG/50ML IV EMUL
INTRAVENOUS | Status: DC | PRN
  Administered 2018-10-08: 80 ug/kg/min via INTRAVENOUS

## 2018-10-08 MED ORDER — PROPOFOL 10 MG/ML IV BOLUS
INTRAVENOUS | Status: DC | PRN
  Administered 2018-10-08: 50 mg via INTRAVENOUS
  Administered 2018-10-08: 30 mg via INTRAVENOUS

## 2018-10-08 MED ORDER — BUTAMBEN-TETRACAINE-BENZOCAINE 2-2-14 % EX AERO
INHALATION_SPRAY | CUTANEOUS | Status: DC | PRN
  Administered 2018-10-08: 2 via TOPICAL

## 2018-10-08 MED ORDER — MAGNESIUM SULFATE 2 GM/50ML IV SOLN
2.0000 g | Freq: Once | INTRAVENOUS | Status: AC
  Administered 2018-10-08: 2 g via INTRAVENOUS
  Filled 2018-10-08: qty 50

## 2018-10-08 MED ORDER — SODIUM CHLORIDE 0.9 % IV SOLN
INTRAVENOUS | Status: DC | PRN
  Administered 2018-10-08: 30 ug/min via INTRAVENOUS

## 2018-10-08 MED ORDER — LACTATED RINGERS IV SOLN
INTRAVENOUS | Status: DC | PRN
  Administered 2018-10-08: 11:00:00 via INTRAVENOUS

## 2018-10-08 MED ORDER — ETOMIDATE 2 MG/ML IV SOLN
INTRAVENOUS | Status: DC | PRN
  Administered 2018-10-08: 30 mg via INTRAVENOUS

## 2018-10-08 MED ORDER — LIDOCAINE 2% (20 MG/ML) 5 ML SYRINGE
INTRAMUSCULAR | Status: DC | PRN
  Administered 2018-10-08: 60 mg via INTRAVENOUS

## 2018-10-08 MED ORDER — KCL-LACTATED RINGERS-D5W 20 MEQ/L IV SOLN
INTRAVENOUS | Status: AC
  Filled 2018-10-08: qty 1000

## 2018-10-08 SURGICAL SUPPLY — 15 items

## 2018-10-08 NOTE — Op Note (Signed)
[facility] Patient Name: Erica Booth Procedure Date : 10/08/2018 MRN: [id number] Attending MD: Jeani Hawking , MD Date of Birth: 22-Mar-1971 CSN: [id number] Age: 48 Admit Type: Inpatient Procedure:                Upper GI endoscopy Indications:              Hematemesis Providers:                Jeani Hawking, MD, Leslie Dales, RN, Zoe Lan,                            RN, Arlee Muslim Tech., Technician, Ponciano Ort,                            CRNA Referring MD:              Medicines:                Propofol per Anesthesia Complications:            No immediate complications. Estimated Blood Loss:     Estimated blood loss: none. Procedure:                Pre-Anesthesia Assessment:                           - Prior to the procedure, a History and Physical                            was performed, and patient medications and                            allergies were reviewed. The patient's tolerance of                            previous anesthesia was also reviewed. The risks                            and benefits of the procedure and the sedation                            options and risks were discussed with the patient.                            All questions were answered, and informed consent                            was obtained. Prior Anticoagulants: The patient has                            taken no previous anticoagulant or antiplatelet                            agents. ASA Grade Assessment: III - A patient with                            severe  systemic disease. After reviewing the risks                            and benefits, the patient was deemed in                            satisfactory condition to undergo the procedure.                           - Sedation was administered by an anesthesia                            professional. Deep sedation was attained.                           After obtaining informed consent, the endoscope was                      passed under direct vision. Throughout the                            procedure, the patient's blood pressure, pulse, and                            oxygen saturations were monitored continuously. The                            GIF-H190 ([id number] Olympus gastroscope was                            introduced through the mouth, and advanced to the                            second part of duodenum. The upper GI endoscopy was                            accomplished without difficulty. The patient                            tolerated the procedure well. Scope In: Scope Out: Findings:      Large (> 5 mm) varices were found in the lower third of the esophagus.       They were 5 mm in largest diameter. Four bands were successfully placed       with complete eradication, resulting in deflation of varices. There was       no bleeding during the procedure.      Mild portal hypertensive gastropathy was found in the gastric fundus and       in the gastric body.      The examined duodenum was normal.      Large distal esophageal varices were identified. There was no evidence       of any red wale signs or any other signs suspicious for a bleeding       source, however, with the enlargement of her esophageal varices and no       other overt sites of bleeding, the varices  were banded. There were no       fundic varices. Impression:               - Large (> 5 mm) esophageal varices. Completely                            eradicated. Banded.                           - Portal hypertensive gastropathy.                           - Normal examined duodenum.                           - No specimens collected. Recommendation:           - Return patient to hospital ward for ongoing care.                           - Clear liquid diet and advance as tolerated.                           - Continue present medications.                           - Repeat upper endoscopy in 2-4 weeks for                             [facility]                           - Treat for a total of 7 days with antibiotics. She                            can be changed over to Cipro 500 mg BID. Procedure Code(s):        --- Professional ---                           [phone], Esophagogastroduodenoscopy, flexible,                            transoral; with band ligation of esophageal/gastric                            varices Diagnosis Code(s):        --- Professional ---                           I85.00, Esophageal varices without bleeding                           K76.6, Portal hypertension                           K31.89, Other diseases of stomach and duodenum  K92.0, Hematemesis CPT copyright 2018 American Medical Association. All rights reserved. The codes documented in this report are preliminary and upon coder review may  be revised to meet current compliance requirements. Jeani Hawking, MD Jeani Hawking, MD 10/08/2018 11:33:32 AM This report has been signed electronically. Number of Addenda: 0

## 2018-10-08 NOTE — Anesthesia Preprocedure Evaluation (Addendum)
Anesthesia Evaluation  Patient identified by MRN, date of birth, ID band Patient awake    Reviewed: Allergy & Precautions, NPO status , Patient's Chart, lab work & pertinent test results  Airway Mallampati: I  TM Distance: >3 FB Neck ROM: Full    Dental  (+) Edentulous Upper, Edentulous Lower   Pulmonary COPD, Current Smoker,    breath sounds clear to auscultation       Cardiovascular hypertension, Pt. on medications negative cardio ROS   Rhythm:Regular Rate:Normal     Neuro/Psych negative neurological ROS  negative psych ROS   GI/Hepatic GERD  Medicated,(+) Cirrhosis   Esophageal Varices  substance abuse  marijuana use, Hepatitis -, C  Endo/Other  negative endocrine ROSdiabetes, Type 2, Oral Hypoglycemic Agents  Renal/GU negative Renal ROS  negative genitourinary   Musculoskeletal negative musculoskeletal ROS (+)   Abdominal   Peds  Hematology  (+) Blood dyscrasia, anemia , Hgb 8.2   Anesthesia Other Findings Presented on 10/06/18 with hematemesis now for EGD, h/o cirrhosis and esophageal varices  Reproductive/Obstetrics                            Anesthesia Physical Anesthesia Plan  ASA: III  Anesthesia Plan: MAC   Post-op Pain Management:    Induction: Intravenous  PONV Risk Score and Plan: 1 and Propofol infusion and Treatment may vary due to age or medical condition  Airway Management Planned: Natural Airway  Additional Equipment:   Intra-op Plan:   Post-operative Plan:   Informed Consent: I have reviewed the patients History and Physical, chart, labs and discussed the procedure including the risks, benefits and alternatives for the proposed anesthesia with the patient or authorized representative who has indicated his/her understanding and acceptance.     Dental advisory given  Plan Discussed with: CRNA  Anesthesia Plan Comments:         Anesthesia Quick  Evaluation

## 2018-10-08 NOTE — Transfer of Care (Signed)
Immediate Anesthesia Transfer of Care Note  Patient: Erica Booth  Procedure(s) Performed: ESOPHAGOGASTRODUODENOSCOPY (EGD) WITH PROPOFOL (Left ) ESOPHAGEAL BANDING  Patient Location: PACU  Anesthesia Type:MAC  Level of Consciousness: drowsy  Airway & Oxygen Therapy: Patient Spontanous Breathing and Patient connected to nasal cannula oxygen  Post-op Assessment: Report given to RN and Post -op Vital signs reviewed and stable  Post vital signs: Reviewed and stable  Last Vitals:  Vitals Value Taken Time  BP    Temp    Pulse    Resp    SpO2      Last Pain:  Vitals:   10/08/18 1009  TempSrc: Oral  PainSc: 4       Patients Stated Pain Goal: 0 (38/32/91 9166)  Complications: No apparent anesthesia complications

## 2018-10-08 NOTE — Interval H&P Note (Signed)
History and Physical Interval Note:  10/08/2018 10:56 AM  Erica Booth  has presented today for surgery, with the diagnosis of Vomiting blood history of esophageal varcies  The various methods of treatment have been discussed with the patient and family. After consideration of risks, benefits and other options for treatment, the patient has consented to  Procedure(s): ESOPHAGOGASTRODUODENOSCOPY (EGD) WITH PROPOFOL (Left) as a surgical intervention .  The patient's history has been reviewed, patient examined, no change in status, stable for surgery.  I have reviewed the patient's chart and labs.  Questions were answered to the patient's satisfaction.     Christain Mcraney D

## 2018-10-08 NOTE — Progress Notes (Signed)
PROGRESS NOTE                                                                                                                                                                                                             Patient Demographics:    Erica Booth, is a 48 y.o. female, DOB - [birth year], MWU:[id number]  Admit date - 10/06/2018   Admitting Physician Haydee Monica, MD  Outpatient Primary MD for the patient is Patient, No Pcp Per  LOS - 2  Chief Complaint  Patient presents with  . Abdominal Pain  . GI Bleeding       Brief Narrative  Erica Booth is a 48 y.o. female with medical history significant of hepatitis C, cirrhosis of the liver with a history of esophageal varices, COPD, diabetes comes in with acute onset of vomiting coffee-ground emesis with some bright red streaking that started several hours ago, she was admitted for Acute UGI bleed.   Subjective:   Patient in bed, appears comfortable, denies any headache, no fever, no chest pain or pressure, no shortness of breath , no abdominal pain. No focal weakness.   Assessment  & Plan :     1. Acute Symptomatic UGI bleed -in a patient with hep C cirrhosis, portal hypertension and history of esophageal varices.  She has been kept on IV PPI and octreotide drip, she is status post 1 unit of packed RBC transfusion in the ER on 10/07/2018, H&H has now remained stable, no signs of ongoing bleed.  GI following EGD later today.  Continue to monitor H&H closely.  2.  Hep C related cirrhosis and portal hypertension.  Supportive care for now.  On Rocephin for SBP prophylaxis.  3.  Essential hypertension.  For now PRN hydralazine.  4.  Hypomagnesemia.  Replaced will monitor.    5. DM type II.  Check A1c and place on sliding scale.  CBG (last 3)  Recent Labs    10/07/18 1153 10/07/18 1703 10/08/18 0815  GLUCAP 129* 109* 113*       Family Communication  :  None  Code Status :  Full  Disposition Plan  :   TBD  Consults  :  GI - Mann  Procedures  :      DVT Prophylaxis  :  SCDs    Lab Results  Component Value Date   PLT 80 (L) 10/08/2018    Diet :  Diet Order  Diet clear liquid Room service appropriate? Yes; Fluid consistency: Thin  Diet effective now               Inpatient Medications Scheduled Meds: . sodium chloride   Intravenous Once  . insulin aspart  0-9 Units Subcutaneous TID WC   Continuous Infusions: . cefTRIAXone (ROCEPHIN)  IV 2 g (10/07/18 1020)  . magnesium sulfate 1 - 4 g bolus IVPB 2 g (10/08/18 0841)  . octreotide  (SANDOSTATIN)    IV infusion 50 mcg/hr (10/07/18 0151)  . pantoprozole (PROTONIX) infusion 8 mg/hr (10/07/18 0626)   PRN Meds:.hydrALAZINE, morphine injection, ondansetron **OR** ondansetron (ZOFRAN) IV  Antibiotics  :   Anti-infectives (From admission, onward)   Start     Dose/Rate Route Frequency Ordered Stop   10/07/18 0945  cefTRIAXone (ROCEPHIN) 1 g in sodium chloride 0.9 % 100 mL IVPB  Status:  Discontinued     1 g 200 mL/hr over 30 Minutes Intravenous Every 24 hours 10/07/18 0930 10/07/18 0930   10/07/18 0945  cefTRIAXone (ROCEPHIN) 2 g in sodium chloride 0.9 % 100 mL IVPB     2 g 200 mL/hr over 30 Minutes Intravenous Every 24 hours 10/07/18 0930 10/12/18 0944          Objective:   Vitals:   10/07/18 2020 10/08/18 0700 10/08/18 0800 10/08/18 0831  BP: 102/68   112/62  Pulse: 90   76  Resp: 12   10  Temp: 98 F (36.7 C)  98.1 F (36.7 C) 98.1 F (36.7 C)  TempSrc: Oral  Oral Oral  SpO2: 98%   95%  Weight:  83.4 kg    Height:        Wt Readings from Last 3 Encounters:  10/08/18 83.4 kg     Intake/Output Summary (Last 24 hours) at 10/08/2018 0915 Last data filed at 10/07/2018 1700 Gross per 24 hour  Intake 401.1 ml  Output -  Net 401.1 ml     Physical Exam  Awake Alert, Oriented X 3, No new F.N deficits, Normal affect [place].AT,PERRAL Supple Neck,No JVD, No cervical lymphadenopathy appriciated.   Symmetrical Chest wall movement, Good air movement bilaterally, CTAB RRR,No Gallops, Rubs or new Murmurs, No Parasternal Heave +ve B.Sounds, Abd Soft, No tenderness, No organomegaly appriciated, No rebound - guarding or rigidity. No Cyanosis, Clubbing or edema, No new Rash or bruise    Data Review:    CBC Recent Labs  Lab 10/06/18 2017 10/07/18 0103 10/07/18 0401 10/07/18 1057 10/07/18 1826 10/07/18 2311 10/08/18 0426  WBC 6.9 7.2 6.2  --   --   --  3.7*  HGB 9.7* 9.2* 8.6* 9.6* 9.3* 8.2* 8.2*  HCT 31.6* 29.6* 27.5* 31.1* 29.8* 25.6* 25.9*  PLT 92* 86* 84*  --   --   --  80*  MCV 90.0 89.4 90.5  --   --   --  90.2  MCH 27.6 27.8 28.3  --   --   --  28.6  MCHC 30.7 31.1 31.3  --   --   --  31.7  RDW 20.5* 20.4* 20.5*  --   --   --  19.9*  LYMPHSABS 2.0  --   --   --   --   --   --   MONOABS 0.6  --   --   --   --   --   --   EOSABS 0.2  --   --   --   --   --   --  BASOSABS 0.0  --   --   --   --   --   --     Chemistries  Recent Labs  Lab 10/06/18 2017 10/07/18 0103 10/07/18 1057 10/08/18 0426  NA 136 141  --  139  K 3.9 3.7  --  3.7  CL 102 109  --  110  CO2 25 23  --  25  GLUCOSE 142* 125*  --  96  BUN 14 16  --  10  CREATININE 0.57 0.61  --  0.61  CALCIUM 8.5* 8.3*  --  8.0*  MG  --   --  1.3*  --   AST 33  --   --  42*  ALT 25  --   --  24  ALKPHOS 54  --   --  46  BILITOT 0.9  --   --  0.8   ------------------------------------------------------------------------------------------------------------------ No results for input(s): CHOL, HDL, LDLCALC, TRIG, CHOLHDL, LDLDIRECT in the last 72 hours.  Lab Results  Component Value Date   HGBA1C 6.3 (H) 10/07/2018   ------------------------------------------------------------------------------------------------------------------ No results for input(s): TSH, T4TOTAL, T3FREE, THYROIDAB in the last 72 hours.  Invalid input(s):  FREET3 ------------------------------------------------------------------------------------------------------------------ No results for input(s): VITAMINB12, FOLATE, FERRITIN, TIBC, IRON, RETICCTPCT in the last 72 hours.  Coagulation profile Recent Labs  Lab 10/06/18 2017 10/07/18 1057  INR 1.44 1.43    No results for input(s): DDIMER in the last 72 hours.  Cardiac Enzymes No results for input(s): CKMB, TROPONINI, MYOGLOBIN in the last 168 hours.  Invalid input(s): CK ------------------------------------------------------------------------------------------------------------------ No results found for: BNP  Micro Results Recent Results (from the past 240 hour(s))  MRSA PCR Screening     Status: None   Collection Time: 10/07/18  1:02 AM  Result Value Ref Range Status   MRSA by PCR NEGATIVE NEGATIVE Final    Comment:        The GeneXpert MRSA Assay (FDA approved for NASAL specimens only), is one component of a comprehensive MRSA colonization surveillance program. It is not intended to diagnose MRSA infection nor to guide or monitor treatment for MRSA infections. Performed at [facility], [address] [place], [place] [address]     Radiology Reports Dg Chest 2 View  Result Date: 10/06/2018 CLINICAL DATA:  48 year old female with vomiting. EXAM: CHEST - 2 VIEW COMPARISON:  Chest radiograph dated 10/28/2017 FINDINGS: The heart size and mediastinal contours are within normal limits. Both lungs are clear. The visualized skeletal structures are unremarkable. IMPRESSION: No active cardiopulmonary disease. Electronically Signed   By: Elgie CollardArash  Radparvar M.D.   On: 10/06/2018 20:42    Time Spent in minutes  30   Susa RaringPrashant  M.D on 10/08/2018 at 9:15 AM  To page go to [email or web address] - password [email or web address]

## 2018-10-09 ENCOUNTER — Encounter (HOSPITAL_COMMUNITY): Payer: Self-pay | Admitting: Gastroenterology

## 2018-10-09 LAB — COMPREHENSIVE METABOLIC PANEL
ALK PHOS: 47 U/L (ref 38–126)
ALT: 24 U/L (ref 0–44)
AST: 41 U/L (ref 15–41)
Albumin: 2.6 g/dL — ABNORMAL LOW (ref 3.5–5.0)
Anion gap: 5 (ref 5–15)
BUN: 6 mg/dL (ref 6–20)
CO2: 25 mmol/L (ref 22–32)
Calcium: 7.6 mg/dL — ABNORMAL LOW (ref 8.9–10.3)
Chloride: 108 mmol/L (ref 98–111)
Creatinine, Ser: 0.58 mg/dL (ref 0.44–1.00)
GFR calc Af Amer: >60 mL/min (ref >60)
GFR calc non Af Amer: >60 mL/min (ref >60)
Glucose, Bld: 122 mg/dL — ABNORMAL HIGH (ref 70–99)
Potassium: 3.3 mmol/L — ABNORMAL LOW (ref 3.5–5.1)
Sodium: 138 mmol/L (ref 135–145)
Total Bilirubin: 0.9 mg/dL (ref 0.3–1.2)
Total Protein: 6.3 g/dL — ABNORMAL LOW (ref 6.5–8.1)

## 2018-10-09 LAB — CBC
HCT: 25.1 % — ABNORMAL LOW (ref 36.0–46.0)
HEMOGLOBIN: 8.2 g/dL — AB (ref 12.0–15.0)
MCH: 29.3 pg (ref 26.0–34.0)
MCHC: 32.7 g/dL (ref 30.0–36.0)
MCV: 89.6 fL (ref 80.0–100.0)
Platelets: 81 10*3/uL — ABNORMAL LOW (ref 150–400)
RBC: 2.8 MIL/uL — ABNORMAL LOW (ref 3.87–5.11)
RDW: 19.5 % — ABNORMAL HIGH (ref 11.5–15.5)
WBC: 3.6 10*3/uL — ABNORMAL LOW (ref 4.0–10.5)
nRBC: 0 % (ref 0.0–0.2)

## 2018-10-09 LAB — GLUCOSE, CAPILLARY: Glucose-Capillary: 141 mg/dL — ABNORMAL HIGH (ref 70–99)

## 2018-10-09 LAB — MAGNESIUM: Magnesium: 1.5 mg/dL — ABNORMAL LOW (ref 1.7–2.4)

## 2018-10-09 MED ORDER — CARVEDILOL 3.125 MG PO TABS
3.1250 mg | ORAL_TABLET | Freq: Two times a day (BID) | ORAL | Status: DC
  Administered 2018-10-09: 3.125 mg via ORAL
  Filled 2018-10-09: qty 1

## 2018-10-09 MED ORDER — CIPROFLOXACIN HCL 500 MG PO TABS
500.0000 mg | ORAL_TABLET | Freq: Two times a day (BID) | ORAL | Status: DC
  Administered 2018-10-09: 500 mg via ORAL
  Filled 2018-10-09: qty 1

## 2018-10-09 MED ORDER — CIPROFLOXACIN HCL 500 MG PO TABS: 500.0000 mg | ORAL_TABLET | Freq: Two times a day (BID) | ORAL | 0 refills | Status: AC

## 2018-10-09 MED ORDER — POTASSIUM CHLORIDE CRYS ER 20 MEQ PO TBCR
40.0000 meq | EXTENDED_RELEASE_TABLET | Freq: Once | ORAL | Status: AC
  Administered 2018-10-09: 40 meq via ORAL
  Filled 2018-10-09: qty 2

## 2018-10-09 MED ORDER — PANTOPRAZOLE SODIUM 40 MG PO TBEC
40.0000 mg | DELAYED_RELEASE_TABLET | Freq: Every day | ORAL | Status: DC
  Administered 2018-10-09: 40 mg via ORAL
  Filled 2018-10-09: qty 1

## 2018-10-09 MED ORDER — MAGNESIUM SULFATE 2 GM/50ML IV SOLN
2.0000 g | Freq: Once | INTRAVENOUS | Status: AC
  Administered 2018-10-09: 2 g via INTRAVENOUS
  Filled 2018-10-09: qty 50

## 2018-10-09 MED ORDER — CARVEDILOL 3.125 MG PO TABS: 3.1250 mg | ORAL_TABLET | Freq: Two times a day (BID) | ORAL | 0 refills | Status: AC

## 2018-10-09 MED ORDER — OMEPRAZOLE 40 MG PO CPDR: 40.0000 mg | DELAYED_RELEASE_CAPSULE | Freq: Every day | ORAL | 0 refills | Status: AC

## 2018-10-09 NOTE — Discharge Instructions (Signed)
Follow with Primary MD in 7 days   Get CBC, CMP, Magnesium, Anemia Panel checked  by Primary MD  in 5-7 days   Activity: As tolerated with Full fall precautions use walker/cane & assistance as needed  Disposition Home    Diet: Heart Healthy     Special Instructions: If you have smoked or chewed Tobacco  in the last 2 yrs please stop smoking, stop any regular Alcohol  and or any Recreational drug use.  On your next visit with your primary care physician please Get Medicines reviewed and adjusted.  Please request your Prim.MD to go over all Hospital Tests and Procedure/Radiological results at the follow up, please get all Hospital records sent to your Prim MD by signing hospital release before you go home.  If you experience worsening of your admission symptoms, develop shortness of breath, life threatening emergency, suicidal or homicidal thoughts you must seek medical attention immediately by calling 911 or calling your MD immediately  if symptoms less severe.  You Must read complete instructions/literature along with all the possible adverse reactions/side effects for all the Medicines you take and that have been prescribed to you. Take any new Medicines after you have completely understood and accpet all the possible adverse reactions/side effects.

## 2018-10-09 NOTE — Anesthesia Postprocedure Evaluation (Signed)
Anesthesia Post Note  Patient: Hilda Lias  Procedure(s) Performed: ESOPHAGOGASTRODUODENOSCOPY (EGD) WITH PROPOFOL (Left ) ESOPHAGEAL BANDING     Patient location during evaluation: Endoscopy Anesthesia Type: MAC Level of consciousness: awake and alert Pain management: pain level controlled Vital Signs Assessment: post-procedure vital signs reviewed and stable Respiratory status: spontaneous breathing, nonlabored ventilation, respiratory function stable and patient connected to nasal cannula oxygen Cardiovascular status: blood pressure returned to baseline and stable Postop Assessment: no apparent nausea or vomiting Anesthetic complications: no    Last Vitals:  Vitals:   10/08/18 2344 10/09/18 0423  BP:  130/75  Pulse:  90  Resp:  14  Temp: 37.1 C 36.7 C  SpO2:  94%    Last Pain:  Vitals:   10/09/18 0423  TempSrc: Oral  PainSc:                  Pierce Barocio L Brilynn Biasi

## 2018-10-09 NOTE — Discharge Summary (Signed)
Erica Booth OKH:[id number] DOB: 05-25-1971 DOA: 10/06/2018  PCP: Patient, No Pcp Per  Admit date: 10/06/2018  Discharge date: 10/09/2018  Admitted From: Home   Disposition:  Home   Recommendations for Outpatient Follow-up:   Follow up with PCP in 1-2 weeks  PCP Please obtain BMP/CBC, 2 view CXR in 1week,  (see Discharge instructions)   PCP Please follow up on the following pending results:    Home Health: None   Equipment/Devices: None  Consultations: GI Discharge Condition: Stable   CODE STATUS: Full   Diet Recommendation: Heart Healthy - Low Carb    Chief Complaint  Patient presents with  . Abdominal Pain  . GI Bleeding     Brief history of present illness from the day of admission and additional interim summary    Erica Booth a 48 y.o.femalewith medical history significant ofhepatitis C, cirrhosis of the liver with a history of esophageal varices, COPD, diabetes comes in with acute onset of vomiting coffee-ground emesis with some bright red streaking that started several hours ago, she was admitted for Acute UGI bleed.                                                                 Hospital Course   1. Acute Symptomatic UGI bleed -in a patient with Hep C cirrhosis, portal hypertension and history of esophageal varices.  She was been kept on IV PPI and octreotide drip, she is status post 1 unit of packed RBC transfusion in the ER on 10/07/2018, H&H has now remained stable, no signs of ongoing bleed.    Seen by GI underwent EGD showing large esophageal varices which were non-bleeding but were banded, case discussed with GI physician Dr. Elnoria Howard this morning, patient stable for discharge on 4 more days of Cipro to complete 7 days of SBP prophylaxis along with oral PPI.  She has also been placed on  beta-blocker to reduce portal hypertension and future chances of upper GI bleed.  She has been requested to follow with PCP and GI within a week of discharge.  2.  Hep C related cirrhosis and portal hypertension.  Supportive care for now.  On Rocephin now Cipro orally for SBP prophylaxis total of 1 week.  3.  Essential hypertension.    Based on Coreg.  4.  Hypomagnesemia and hypokalemia.  Replaced will request PCP to monitor.    5. DM type II. Continue home regimen.     Discharge diagnosis     Principal Problem:   GIB (gastrointestinal bleeding) Active Problems:   Hepatitis C   COPD (chronic obstructive pulmonary disease) (HCC)   Cirrhosis (HCC)   Hypertension   Diabetes mellitus without complication (HCC)   Varices, esophageal (HCC)    Discharge instructions    Discharge Instructions  Diet - low sodium heart healthy   Complete by:  As directed    Discharge instructions   Complete by:  As directed    Follow with Primary MD in 7 days   Get CBC, CMP, Magnesium, Anemia Panel checked  by Primary MD  in 5-7 days   Activity: As tolerated with Full fall precautions use walker/cane & assistance as needed  Disposition Home    Diet: Heart Healthy     Special Instructions: If you have smoked or chewed Tobacco  in the last 2 yrs please stop smoking, stop any regular Alcohol  and or any Recreational drug use.  On your next visit with your primary care physician please Get Medicines reviewed and adjusted.  Please request your Prim.MD to go over all Hospital Tests and Procedure/Radiological results at the follow up, please get all Hospital records sent to your Prim MD by signing hospital release before you go home.  If you experience worsening of your admission symptoms, develop shortness of breath, life threatening emergency, suicidal or homicidal thoughts you must seek medical attention immediately by calling 911 or calling your MD immediately  if symptoms less  severe.  You Must read complete instructions/literature along with all the possible adverse reactions/side effects for all the Medicines you take and that have been prescribed to you. Take any new Medicines after you have completely understood and accpet all the possible adverse reactions/side effects.   Increase activity slowly   Complete by:  As directed       Discharge Medications   Allergies as of 10/09/2018      Reactions   Amitriptyline Other (See Comments)   Other reaction(s): Hallucinations Hallucinations    Ondansetron Other (See Comments)   Makes her liver hurt   Prednisone Other (See Comments)   Makes her liver hurt.      Medication List    STOP taking these medications   ibuprofen 200 MG tablet Commonly known as:  ADVIL,MOTRIN   LINZESS 290 MCG Caps capsule Generic drug:  linaclotide     TAKE these medications   carvedilol 3.125 MG tablet Commonly known as:  COREG Take 1 tablet (3.125 mg total) by mouth 2 (two) times daily with a meal.   ciprofloxacin 500 MG tablet Commonly known as:  CIPRO Take 1 tablet (500 mg total) by mouth 2 (two) times daily.   lactulose 10 GM/15ML solution Commonly known as:  CHRONULAC Take 30 mLs by mouth 3 (three) times daily.   lisinopril 20 MG tablet Commonly known as:  PRINIVIL,ZESTRIL Take 40 mg by mouth daily.   metFORMIN 1000 MG tablet Commonly known as:  GLUCOPHAGE Take 1,000 mg by mouth 2 (two) times daily.   omeprazole 40 MG capsule Commonly known as:  PRILOSEC Take 1 capsule (40 mg total) by mouth daily. What changed:    medication strength  how much to take       Follow-up Information    Your PCP. Schedule an appointment as soon as possible for a visit in 1 week(s).        Jeani Hawking, MD. Schedule an appointment as soon as possible for a visit in 1 week(s).   Specialty:  Gastroenterology Contact information: [address] [place] [address] [phone]           Major  procedures and Radiology Reports - PLEASE review detailed and final reports thoroughly  -     EGD -   Impression:               -  Large (> 5 mm) esophageal varices. Completely                            eradicated. Banded.                           - Portal hypertensive gastropathy.                           - Normal examined duodenum.                           - No specimens collected. Recommendation:           - Return patient to hospital ward for ongoing care.                           - Clear liquid diet and advance as tolerated.                           - Continue present medications.                           - Repeat upper endoscopy in 2-4 weeks for                            retreatment with primary GI at [facility].                           - Treat for a total of 7 days with antibiotics. She                            can be changed over to Cipro 500 mg BID.   Dg Chest 2 View  Result Date: 10/06/2018 CLINICAL DATA:  48 year old female with vomiting. EXAM: CHEST - 2 VIEW COMPARISON:  Chest radiograph dated 10/28/2017 FINDINGS: The heart size and mediastinal contours are within normal limits. Both lungs are clear. The visualized skeletal structures are unremarkable. IMPRESSION: No active cardiopulmonary disease. Electronically Signed   By: Elgie Collard M.D.   On: 10/06/2018 20:42    Micro Results     Recent Results (from the past 240 hour(s))  MRSA PCR Screening     Status: None   Collection Time: 10/07/18  1:02 AM  Result Value Ref Range Status   MRSA by PCR NEGATIVE NEGATIVE Final    Comment:        The GeneXpert MRSA Assay (FDA approved for NASAL specimens only), is one component of a comprehensive MRSA colonization surveillance program. It is not intended to diagnose MRSA infection nor to guide or monitor treatment for MRSA infections. Performed at [facility], [address], [place], [place] [address]     Today   Subjective    Erica Booth today  has no headache,no chest abdominal pain,no new weakness tingling or numbness, feels much better wants to go home today.     Objective   Blood pressure 130/75, pulse 90, temperature 98.1 F (36.7 C), temperature source Oral, resp. rate 14, height 5\' 6"  (1.676 m), weight 82.7 kg, SpO2 94 %.   Intake/Output Summary (Last  24 hours) at 10/09/2018 0833 Last data filed at 10/08/2018 1125 Gross per 24 hour  Intake 200 ml  Output -  Net 200 ml    Exam  Awake Alert, Oriented x 3, No new F.N deficits, Normal affect [place].AT,PERRAL Supple Neck,No JVD, No cervical lymphadenopathy appriciated.  Symmetrical Chest wall movement, Good air movement bilaterally, CTAB RRR,No Gallops,Rubs or new Murmurs, No Parasternal Heave +ve B.Sounds, Abd Soft, Non tender, No organomegaly appriciated, No rebound -guarding or rigidity. No Cyanosis, Clubbing or edema, No new Rash or bruise   Data Review   CBC w Diff:  Lab Results  Component Value Date   WBC 3.6 (L) 10/09/2018   HGB 8.2 (L) 10/09/2018   HCT 25.1 (L) 10/09/2018   PLT 81 (L) 10/09/2018   LYMPHOPCT 29 10/06/2018   MONOPCT 9 10/06/2018   EOSPCT 3 10/06/2018   BASOPCT 0 10/06/2018    CMP:  Lab Results  Component Value Date   NA 138 10/09/2018   K 3.3 (L) 10/09/2018   CL 108 10/09/2018   CO2 25 10/09/2018   BUN 6 10/09/2018   CREATININE 0.58 10/09/2018   PROT 6.3 (L) 10/09/2018   ALBUMIN 2.6 (L) 10/09/2018   BILITOT 0.9 10/09/2018   ALKPHOS 47 10/09/2018   AST 41 10/09/2018   ALT 24 10/09/2018  .   Total Time in preparing paper work, data evaluation and todays exam - 35 minutes  Susa RaringPrashant Duval Macleod M.D on 10/09/2018 at 8:33 AM  [facility]   Office  [phone]

## 2018-10-09 NOTE — Progress Notes (Signed)
Erica Booth discharged Home per MD order.  Discharge instructions reviewed and discussed with the patient, all questions and concerns answered. Copy of instructions, care notes for new medications /diagnosis and scripts given to patient.  Allergies as of 10/09/2018      Reactions   Amitriptyline Other (See Comments)   Other reaction(s): Hallucinations Hallucinations    Ondansetron Other (See Comments)   Makes her liver hurt   Prednisone Other (See Comments)   Makes her liver hurt.      Medication List    STOP taking these medications   ibuprofen 200 MG tablet Commonly known as:  ADVIL,MOTRIN   LINZESS 290 MCG Caps capsule Generic drug:  linaclotide     TAKE these medications   carvedilol 3.125 MG tablet Commonly known as:  COREG Take 1 tablet (3.125 mg total) by mouth 2 (two) times daily with a meal.   ciprofloxacin 500 MG tablet Commonly known as:  CIPRO Take 1 tablet (500 mg total) by mouth 2 (two) times daily.   lactulose 10 GM/15ML solution Commonly known as:  CHRONULAC Take 30 mLs by mouth 3 (three) times daily.   lisinopril 20 MG tablet Commonly known as:  PRINIVIL,ZESTRIL Take 40 mg by mouth daily.   metFORMIN 1000 MG tablet Commonly known as:  GLUCOPHAGE Take 1,000 mg by mouth 2 (two) times daily.   omeprazole 40 MG capsule Commonly known as:  PRILOSEC Take 1 capsule (40 mg total) by mouth daily. What changed:    medication strength  how much to take       IV site discontinued and catheter remains intact. Site without signs and symptoms of complications. Dressing and pressure applied.  Patient escorted to car by NT/volunteer in a wheelchair,  no distress noted upon discharge.  Laural Benes, Imani Fiebelkorn C 10/09/2018 10:06 AM

## 2018-10-09 NOTE — Care Management Note (Signed)
Case Management Note  Patient Details  Name: Erica Booth MRN: [id number] Date of Birth: 12-Jun-1971  Subjective/Objective:       GIB            [name] (Daughter)     [phone]       PCP: Georganna Skeans  Action/Plan: Transition to home. Pt states has transportation to home.  Expected Discharge Date:  10/09/18               Expected Discharge Plan:  Home/Self Care(Resides with husband , daughter)  In-House Referral:  NA  Discharge planning Services  NA  Post Acute Care Choice:  NA Choice offered to:  NA  DME Arranged:  N/A DME Agency:  NA  HH Arranged:  NA HH Agency:     Status of Service:  Completed, signed off  If discussed at Long Length of Stay Meetings, dates discussed:    Additional Comments:  Epifanio Lesches, RN 10/09/2018, 9:05 AM

## 2018-11-07 MED ORDER — LISINOPRIL 20 MG TABLET
0 refills | 0 days | Status: CP
Start: 2018-11-07 — End: 2019-03-10

## 2018-11-27 ENCOUNTER — Encounter
Admit: 2018-11-27 | Discharge: 2018-11-28 | Payer: PRIVATE HEALTH INSURANCE | Attending: Anesthesiology | Primary: Anesthesiology

## 2018-11-27 DIAGNOSIS — M549 Dorsalgia, unspecified: Principal | ICD-10-CM

## 2018-11-27 DIAGNOSIS — E119 Type 2 diabetes mellitus without complications: Principal | ICD-10-CM

## 2018-11-27 DIAGNOSIS — E782 Mixed hyperlipidemia: Principal | ICD-10-CM

## 2018-11-27 DIAGNOSIS — I10 Essential (primary) hypertension: Principal | ICD-10-CM

## 2018-11-27 DIAGNOSIS — M79673 Pain in unspecified foot: Principal | ICD-10-CM

## 2018-11-27 DIAGNOSIS — G8929 Other chronic pain: Principal | ICD-10-CM

## 2018-11-27 DIAGNOSIS — M545 Low back pain: Secondary | ICD-10-CM

## 2018-11-27 DIAGNOSIS — M79643 Pain in unspecified hand: Secondary | ICD-10-CM

## 2018-11-27 DIAGNOSIS — J189 Pneumonia, unspecified organism: Principal | ICD-10-CM

## 2018-11-27 DIAGNOSIS — E876 Hypokalemia: Principal | ICD-10-CM

## 2018-11-27 DIAGNOSIS — S73004A Unspecified dislocation of right hip, initial encounter: Principal | ICD-10-CM

## 2018-11-27 DIAGNOSIS — B1920 Unspecified viral hepatitis C without hepatic coma: Principal | ICD-10-CM

## 2018-11-27 DIAGNOSIS — E559 Vitamin D deficiency, unspecified: Principal | ICD-10-CM

## 2018-11-27 DIAGNOSIS — K922 Gastrointestinal hemorrhage, unspecified: Principal | ICD-10-CM

## 2018-11-27 DIAGNOSIS — J449 Chronic obstructive pulmonary disease, unspecified: Principal | ICD-10-CM

## 2018-11-27 MED ORDER — DICLOFENAC 1 % TOPICAL GEL
Freq: Four times a day (QID) | TOPICAL | 1 refills | 0 days | Status: CP
Start: 2018-11-27 — End: 2019-01-22

## 2018-11-27 MED ORDER — GABAPENTIN 100 MG CAPSULE
ORAL_CAPSULE | Freq: Two times a day (BID) | ORAL | 1 refills | 0.00000 days | Status: CP
Start: 2018-11-27 — End: 2019-01-22

## 2018-12-23 MED ORDER — METFORMIN 1,000 MG TABLET
0 refills | 0 days | Status: CP
Start: 2018-12-23 — End: 2019-02-24

## 2019-01-22 ENCOUNTER — Institutional Professional Consult (permissible substitution)
Admit: 2019-01-22 | Discharge: 2019-01-23 | Payer: PRIVATE HEALTH INSURANCE | Attending: Anesthesiology | Primary: Anesthesiology

## 2019-01-22 DIAGNOSIS — M79673 Pain in unspecified foot: Secondary | ICD-10-CM

## 2019-01-22 DIAGNOSIS — M79643 Pain in unspecified hand: Secondary | ICD-10-CM

## 2019-01-22 DIAGNOSIS — M545 Low back pain: Secondary | ICD-10-CM

## 2019-01-22 DIAGNOSIS — G8929 Other chronic pain: Principal | ICD-10-CM

## 2019-01-22 MED ORDER — DICLOFENAC 1 % TOPICAL GEL
Freq: Four times a day (QID) | TOPICAL | 5 refills | 0.00000 days | Status: CP
Start: 2019-01-22 — End: 2019-02-25

## 2019-01-30 ENCOUNTER — Encounter
Admit: 2019-01-30 | Discharge: 2019-01-31 | Payer: PRIVATE HEALTH INSURANCE | Attending: Family Medicine | Primary: Family Medicine

## 2019-01-30 DIAGNOSIS — R252 Cramp and spasm: Secondary | ICD-10-CM

## 2019-01-30 DIAGNOSIS — E118 Type 2 diabetes mellitus with unspecified complications: Principal | ICD-10-CM

## 2019-01-30 MED ORDER — ROPINIROLE 0.25 MG TABLET
ORAL_TABLET | Freq: Three times a day (TID) | ORAL | 0 refills | 0 days | Status: CP
Start: 2019-01-30 — End: 2019-03-07

## 2019-02-05 MED ORDER — OMEPRAZOLE 20 MG CAPSULE,DELAYED RELEASE
ORAL_CAPSULE | Freq: Every day | ORAL | 0 refills | 0 days | Status: CP
Start: 2019-02-05 — End: 2019-02-12

## 2019-02-12 ENCOUNTER — Encounter: Admit: 2019-02-12 | Discharge: 2019-02-12 | Payer: PRIVATE HEALTH INSURANCE

## 2019-02-12 ENCOUNTER — Institutional Professional Consult (permissible substitution): Admit: 2019-02-12 | Discharge: 2019-02-13 | Payer: PRIVATE HEALTH INSURANCE | Attending: Family | Primary: Family

## 2019-02-12 DIAGNOSIS — K7460 Unspecified cirrhosis of liver: Principal | ICD-10-CM

## 2019-02-12 DIAGNOSIS — K219 Gastro-esophageal reflux disease without esophagitis: Secondary | ICD-10-CM

## 2019-02-12 DIAGNOSIS — R188 Other ascites: Secondary | ICD-10-CM

## 2019-02-12 DIAGNOSIS — I8510 Secondary esophageal varices without bleeding: Principal | ICD-10-CM

## 2019-02-12 MED ORDER — CARVEDILOL 3.125 MG TABLET
ORAL_TABLET | Freq: Two times a day (BID) | ORAL | 2 refills | 0.00000 days | Status: CP
Start: 2019-02-12 — End: ?

## 2019-02-12 MED ORDER — OMEPRAZOLE 20 MG CAPSULE,DELAYED RELEASE
ORAL_CAPSULE | Freq: Every day | ORAL | 2 refills | 0 days | Status: CP
Start: 2019-02-12 — End: ?

## 2019-02-22 ENCOUNTER — Encounter: Admit: 2019-02-22 | Discharge: 2019-02-23 | Payer: PRIVATE HEALTH INSURANCE

## 2019-02-22 DIAGNOSIS — Z1159 Encounter for screening for other viral diseases: Principal | ICD-10-CM

## 2019-02-24 MED ORDER — METFORMIN 1,000 MG TABLET
ORAL_TABLET | 0 refills | 0 days | Status: CP
Start: 2019-02-24 — End: 2019-05-06

## 2019-02-25 ENCOUNTER — Encounter
Admit: 2019-02-25 | Discharge: 2019-02-25 | Payer: PRIVATE HEALTH INSURANCE | Attending: Certified Registered" | Primary: Certified Registered"

## 2019-02-25 ENCOUNTER — Encounter: Admit: 2019-02-25 | Discharge: 2019-02-25 | Payer: PRIVATE HEALTH INSURANCE

## 2019-02-25 DIAGNOSIS — I8511 Secondary esophageal varices with bleeding: Principal | ICD-10-CM

## 2019-03-06 ENCOUNTER — Encounter: Admit: 2019-03-06 | Discharge: 2019-03-07 | Payer: PRIVATE HEALTH INSURANCE | Attending: Family | Primary: Family

## 2019-03-06 DIAGNOSIS — Z8619 Personal history of other infectious and parasitic diseases: Principal | ICD-10-CM

## 2019-03-07 ENCOUNTER — Ambulatory Visit
Admit: 2019-03-07 | Discharge: 2019-03-08 | Payer: PRIVATE HEALTH INSURANCE | Attending: Family Medicine | Primary: Family Medicine

## 2019-03-07 DIAGNOSIS — F329 Major depressive disorder, single episode, unspecified: Secondary | ICD-10-CM

## 2019-03-07 DIAGNOSIS — E118 Type 2 diabetes mellitus with unspecified complications: Secondary | ICD-10-CM

## 2019-03-07 DIAGNOSIS — R252 Cramp and spasm: Principal | ICD-10-CM

## 2019-03-07 MED ORDER — ROPINIROLE 0.25 MG TABLET
ORAL_TABLET | Freq: Three times a day (TID) | ORAL | 2 refills | 0.00000 days | Status: CP
Start: 2019-03-07 — End: 2020-03-06

## 2019-03-07 MED ORDER — BLOOD SUGAR DIAGNOSTIC STRIPS
11 refills | 0 days | Status: CP
Start: 2019-03-07 — End: ?

## 2019-03-07 MED ORDER — BLOOD-GLUCOSE METER KIT WRAPPER
0 refills | 0 days | Status: CP
Start: 2019-03-07 — End: 2020-03-06

## 2019-03-07 MED ORDER — LANCETS
11 refills | 0 days | Status: CP
Start: 2019-03-07 — End: ?

## 2019-03-10 MED ORDER — LISINOPRIL 20 MG TABLET
ORAL_TABLET | 0 refills | 0 days | Status: CP
Start: 2019-03-10 — End: ?

## 2019-03-22 ENCOUNTER — Encounter: Admit: 2019-03-22 | Discharge: 2019-03-23 | Payer: PRIVATE HEALTH INSURANCE

## 2019-03-22 DIAGNOSIS — Z1159 Encounter for screening for other viral diseases: Principal | ICD-10-CM

## 2019-03-24 DIAGNOSIS — I8500 Esophageal varices without bleeding: Principal | ICD-10-CM

## 2019-03-25 ENCOUNTER — Encounter
Admit: 2019-03-25 | Discharge: 2019-03-25 | Payer: PRIVATE HEALTH INSURANCE | Attending: Anesthesiology | Primary: Anesthesiology

## 2019-03-25 ENCOUNTER — Encounter: Admit: 2019-03-25 | Discharge: 2019-03-25 | Payer: PRIVATE HEALTH INSURANCE

## 2019-03-25 DIAGNOSIS — I8500 Esophageal varices without bleeding: Principal | ICD-10-CM

## 2019-04-21 ENCOUNTER — Encounter
Admit: 2019-04-21 | Discharge: 2019-04-22 | Payer: PRIVATE HEALTH INSURANCE | Attending: Family Medicine | Primary: Family Medicine

## 2019-04-21 DIAGNOSIS — E118 Type 2 diabetes mellitus with unspecified complications: Principal | ICD-10-CM

## 2019-04-21 DIAGNOSIS — G629 Polyneuropathy, unspecified: Secondary | ICD-10-CM

## 2019-04-21 MED ORDER — DULOXETINE 20 MG CAPSULE,DELAYED RELEASE
ORAL_CAPSULE | Freq: Every day | ORAL | 0 refills | 90.00000 days | Status: CP
Start: 2019-04-21 — End: 2020-04-20

## 2019-05-02 ENCOUNTER — Encounter
Admit: 2019-05-02 | Discharge: 2019-05-03 | Payer: PRIVATE HEALTH INSURANCE | Attending: Family Medicine | Primary: Family Medicine

## 2019-05-02 DIAGNOSIS — G629 Polyneuropathy, unspecified: Principal | ICD-10-CM

## 2019-05-02 MED ORDER — GABAPENTIN 300 MG CAPSULE
ORAL_CAPSULE | Freq: Three times a day (TID) | ORAL | 1 refills | 30 days | Status: CP
Start: 2019-05-02 — End: 2020-05-01

## 2019-05-06 MED ORDER — METFORMIN 1,000 MG TABLET
ORAL_TABLET | 0 refills | 0 days | Status: CP
Start: 2019-05-06 — End: ?

## 2019-06-09 ENCOUNTER — Ambulatory Visit
Admit: 2019-06-09 | Discharge: 2019-06-10 | Payer: PRIVATE HEALTH INSURANCE | Attending: Family Medicine | Primary: Family Medicine

## 2019-06-09 DIAGNOSIS — Z23 Encounter for immunization: Secondary | ICD-10-CM

## 2019-06-09 DIAGNOSIS — F329 Major depressive disorder, single episode, unspecified: Secondary | ICD-10-CM

## 2019-06-09 DIAGNOSIS — A630 Anogenital (venereal) warts: Secondary | ICD-10-CM

## 2019-06-09 DIAGNOSIS — E118 Type 2 diabetes mellitus with unspecified complications: Secondary | ICD-10-CM

## 2019-06-09 DIAGNOSIS — F17200 Nicotine dependence, unspecified, uncomplicated: Secondary | ICD-10-CM

## 2019-06-20 MED ORDER — LISINOPRIL 20 MG TABLET
ORAL_TABLET | 0 refills | 0 days | Status: CP
Start: 2019-06-20 — End: ?

## 2019-07-30 ENCOUNTER — Encounter
Admit: 2019-07-30 | Discharge: 2019-07-31 | Payer: PRIVATE HEALTH INSURANCE | Attending: Anesthesiology | Primary: Anesthesiology

## 2019-07-30 DIAGNOSIS — G8929 Other chronic pain: Secondary | ICD-10-CM

## 2019-07-30 DIAGNOSIS — M545 Low back pain: Principal | ICD-10-CM

## 2019-07-30 DIAGNOSIS — G894 Chronic pain syndrome: Principal | ICD-10-CM

## 2019-07-30 DIAGNOSIS — M7918 Myalgia, other site: Principal | ICD-10-CM

## 2019-08-22 MED ORDER — ESCITALOPRAM 20 MG TABLET
ORAL_TABLET | Freq: Every day | ORAL | 0 refills | 90.00000 days | Status: CP
Start: 2019-08-22 — End: ?

## 2019-08-22 MED ORDER — METFORMIN 1,000 MG TABLET
ORAL_TABLET | Freq: Two times a day (BID) | ORAL | 0 refills | 120 days | Status: CP
Start: 2019-08-22 — End: ?

## 2019-09-08 ENCOUNTER — Encounter
Admit: 2019-09-08 | Discharge: 2019-09-09 | Payer: PRIVATE HEALTH INSURANCE | Attending: Family Medicine | Primary: Family Medicine

## 2019-09-08 DIAGNOSIS — F17200 Nicotine dependence, unspecified, uncomplicated: Principal | ICD-10-CM

## 2019-09-08 DIAGNOSIS — I10 Essential (primary) hypertension: Principal | ICD-10-CM

## 2019-09-08 DIAGNOSIS — E1140 Type 2 diabetes mellitus with diabetic neuropathy, unspecified: Principal | ICD-10-CM

## 2019-09-08 DIAGNOSIS — F329 Major depressive disorder, single episode, unspecified: Principal | ICD-10-CM

## 2019-09-08 MED ORDER — OMEPRAZOLE 20 MG CAPSULE,DELAYED RELEASE
ORAL_CAPSULE | Freq: Every day | ORAL | 0 refills | 90.00000 days | Status: CP
Start: 2019-09-08 — End: ?

## 2019-09-08 MED ORDER — GABAPENTIN 300 MG CAPSULE
ORAL_CAPSULE | Freq: Three times a day (TID) | ORAL | 2 refills | 30.00000 days | Status: CP
Start: 2019-09-08 — End: 2020-09-07

## 2019-10-06 MED ORDER — LISINOPRIL 20 MG TABLET
ORAL_TABLET | 0 refills | 0 days | Status: CP
Start: 2019-10-06 — End: ?

## 2019-10-08 DIAGNOSIS — I8510 Secondary esophageal varices without bleeding: Principal | ICD-10-CM

## 2019-10-08 MED ORDER — OMEPRAZOLE 20 MG CAPSULE,DELAYED RELEASE
ORAL_CAPSULE | Freq: Every day | ORAL | 0 refills | 90.00000 days | Status: CP
Start: 2019-10-08 — End: ?

## 2019-10-08 MED ORDER — CARVEDILOL 3.125 MG TABLET
ORAL_TABLET | Freq: Two times a day (BID) | ORAL | 2 refills | 90.00000 days | Status: CP
Start: 2019-10-08 — End: ?

## 2019-10-08 MED ORDER — LISINOPRIL 20 MG TABLET
ORAL_TABLET | Freq: Two times a day (BID) | ORAL | 0 refills | 90.00000 days | Status: CP
Start: 2019-10-08 — End: ?

## 2019-10-08 MED ORDER — GABAPENTIN 300 MG CAPSULE
ORAL_CAPSULE | Freq: Three times a day (TID) | ORAL | 2 refills | 30 days | Status: CP
Start: 2019-10-08 — End: 2020-10-07

## 2019-10-08 MED ORDER — METFORMIN 1,000 MG TABLET
ORAL_TABLET | Freq: Two times a day (BID) | ORAL | 0 refills | 120 days | Status: CP
Start: 2019-10-08 — End: ?

## 2019-10-08 MED ORDER — ESCITALOPRAM 20 MG TABLET
ORAL_TABLET | Freq: Every day | ORAL | 0 refills | 90 days | Status: CP
Start: 2019-10-08 — End: ?

## 2019-10-08 MED ORDER — ROPINIROLE 0.25 MG TABLET
ORAL_TABLET | Freq: Three times a day (TID) | ORAL | 2 refills | 30 days | Status: CP
Start: 2019-10-08 — End: 2020-10-07

## 2019-12-04 MED ORDER — LISINOPRIL 20 MG TABLET
ORAL_TABLET | 0 refills | 0 days | Status: CP
Start: 2019-12-04 — End: ?

## 2019-12-04 MED ORDER — OMEPRAZOLE 20 MG CAPSULE,DELAYED RELEASE
ORAL_CAPSULE | 0 refills | 0 days | Status: CP
Start: 2019-12-04 — End: ?

## 2020-01-05 ENCOUNTER — Encounter
Admit: 2020-01-05 | Discharge: 2020-01-06 | Payer: PRIVATE HEALTH INSURANCE | Attending: Family Medicine | Primary: Family Medicine

## 2020-01-05 DIAGNOSIS — E1140 Type 2 diabetes mellitus with diabetic neuropathy, unspecified: Principal | ICD-10-CM

## 2020-01-21 DIAGNOSIS — K5909 Other constipation: Principal | ICD-10-CM

## 2020-01-21 MED ORDER — LISINOPRIL 20 MG TABLET
ORAL_TABLET | 0 refills | 0 days | Status: CP
Start: 2020-01-21 — End: ?

## 2020-01-21 MED ORDER — CONSTULOSE 10 GRAM/15 ML ORAL SOLUTION
0 refills | 0 days
Start: 2020-01-21 — End: ?

## 2020-01-22 DIAGNOSIS — Z8619 Personal history of other infectious and parasitic diseases: Principal | ICD-10-CM

## 2020-01-22 DIAGNOSIS — Z129 Encounter for screening for malignant neoplasm, site unspecified: Principal | ICD-10-CM

## 2020-01-22 DIAGNOSIS — K7460 Unspecified cirrhosis of liver: Principal | ICD-10-CM

## 2020-02-27 MED ORDER — OMEPRAZOLE 20 MG CAPSULE,DELAYED RELEASE
ORAL_CAPSULE | 0 refills | 0 days
Start: 2020-02-27 — End: ?

## 2020-04-02 MED ORDER — ROPINIROLE 0.25 MG TABLET
ORAL_TABLET | 0 refills | 0 days | Status: CP
Start: 2020-04-02 — End: ?

## 2020-04-05 ENCOUNTER — Ambulatory Visit: Admit: 2020-04-05 | Payer: PRIVATE HEALTH INSURANCE | Attending: Family Medicine | Primary: Family Medicine

## 2020-05-18 ENCOUNTER — Ambulatory Visit
Admit: 2020-05-18 | Discharge: 2020-05-19 | Payer: PRIVATE HEALTH INSURANCE | Attending: Family Medicine | Primary: Family Medicine

## 2020-05-18 DIAGNOSIS — F17200 Nicotine dependence, unspecified, uncomplicated: Principal | ICD-10-CM

## 2020-05-18 DIAGNOSIS — I10 Essential (primary) hypertension: Principal | ICD-10-CM

## 2020-05-18 DIAGNOSIS — I8510 Secondary esophageal varices without bleeding: Principal | ICD-10-CM

## 2020-05-18 DIAGNOSIS — E782 Mixed hyperlipidemia: Principal | ICD-10-CM

## 2020-05-18 DIAGNOSIS — E1140 Type 2 diabetes mellitus with diabetic neuropathy, unspecified: Principal | ICD-10-CM

## 2020-05-18 DIAGNOSIS — F329 Major depressive disorder, single episode, unspecified: Principal | ICD-10-CM

## 2020-05-18 MED ORDER — ROPINIROLE 0.25 MG TABLET
ORAL_TABLET | Freq: Three times a day (TID) | ORAL | 0 refills | 30.00000 days
Start: 2020-05-18 — End: ?

## 2020-05-18 MED ORDER — OMEPRAZOLE 20 MG CAPSULE,DELAYED RELEASE
ORAL_CAPSULE | Freq: Every day | ORAL | 0 refills | 90.00000 days
Start: 2020-05-18 — End: ?

## 2020-05-18 MED ORDER — CARVEDILOL 3.125 MG TABLET
ORAL_TABLET | Freq: Two times a day (BID) | ORAL | 2 refills | 90 days
Start: 2020-05-18 — End: ?

## 2020-05-19 MED ORDER — CARVEDILOL 3.125 MG TABLET
ORAL_TABLET | Freq: Two times a day (BID) | ORAL | 2 refills | 90 days | Status: CP
Start: 2020-05-19 — End: ?

## 2020-05-19 MED ORDER — ROPINIROLE 0.25 MG TABLET
ORAL_TABLET | Freq: Three times a day (TID) | ORAL | 0 refills | 30 days | Status: CP
Start: 2020-05-19 — End: ?

## 2020-05-19 MED ORDER — OMEPRAZOLE 20 MG CAPSULE,DELAYED RELEASE
ORAL_CAPSULE | Freq: Every day | ORAL | 0 refills | 90 days | Status: CP
Start: 2020-05-19 — End: ?

## 2020-07-04 MED ORDER — LISINOPRIL 20 MG TABLET
ORAL_TABLET | 0 refills | 0 days
Start: 2020-07-04 — End: ?

## 2020-07-21 ENCOUNTER — Ambulatory Visit
Admit: 2020-07-21 | Discharge: 2020-07-22 | Payer: PRIVATE HEALTH INSURANCE | Attending: Anesthesiology | Primary: Anesthesiology

## 2020-07-21 DIAGNOSIS — M7918 Myalgia, other site: Principal | ICD-10-CM

## 2020-07-21 DIAGNOSIS — M25511 Pain in right shoulder: Principal | ICD-10-CM

## 2020-07-21 DIAGNOSIS — M25512 Pain in left shoulder: Principal | ICD-10-CM

## 2020-07-21 DIAGNOSIS — G8929 Other chronic pain: Principal | ICD-10-CM

## 2020-07-21 DIAGNOSIS — M545 Chronic midline low back pain without sciatica: Principal | ICD-10-CM

## 2020-07-21 DIAGNOSIS — G894 Chronic pain syndrome: Principal | ICD-10-CM

## 2020-07-21 DIAGNOSIS — M79673 Pain in unspecified foot: Principal | ICD-10-CM

## 2020-07-21 MED ORDER — PREGABALIN 100 MG CAPSULE
ORAL_CAPSULE | Freq: Two times a day (BID) | ORAL | 2 refills | 30 days | Status: CP
Start: 2020-07-21 — End: 2020-10-20

## 2020-08-17 ENCOUNTER — Ambulatory Visit: Admit: 2020-08-17 | Discharge: 2020-09-15 | Payer: PRIVATE HEALTH INSURANCE

## 2020-08-17 ENCOUNTER — Ambulatory Visit
Admit: 2020-08-17 | Discharge: 2020-09-15 | Payer: PRIVATE HEALTH INSURANCE | Attending: Rehabilitative and Restorative Service Providers" | Primary: Rehabilitative and Restorative Service Providers"

## 2020-08-17 DIAGNOSIS — M25512 Pain in left shoulder: Principal | ICD-10-CM

## 2020-08-17 DIAGNOSIS — M25511 Pain in right shoulder: Principal | ICD-10-CM

## 2020-08-17 DIAGNOSIS — G8929 Other chronic pain: Principal | ICD-10-CM

## 2020-08-27 ENCOUNTER — Ambulatory Visit
Admit: 2020-08-27 | Discharge: 2020-08-28 | Payer: PRIVATE HEALTH INSURANCE | Attending: Podiatrist | Primary: Podiatrist

## 2020-08-27 DIAGNOSIS — Z87891 Personal history of nicotine dependence: Principal | ICD-10-CM

## 2020-08-27 DIAGNOSIS — E1140 Type 2 diabetes mellitus with diabetic neuropathy, unspecified: Principal | ICD-10-CM

## 2020-08-27 DIAGNOSIS — M2042 Other hammer toe(s) (acquired), left foot: Principal | ICD-10-CM

## 2020-08-27 DIAGNOSIS — M79675 Pain in left toe(s): Principal | ICD-10-CM

## 2020-08-27 DIAGNOSIS — M2041 Other hammer toe(s) (acquired), right foot: Principal | ICD-10-CM

## 2020-08-27 DIAGNOSIS — M216X9 Other acquired deformities of unspecified foot: Principal | ICD-10-CM

## 2020-08-27 DIAGNOSIS — M79673 Pain in unspecified foot: Principal | ICD-10-CM

## 2020-08-27 DIAGNOSIS — G8929 Other chronic pain: Principal | ICD-10-CM

## 2020-08-27 DIAGNOSIS — M79674 Pain in right toe(s): Principal | ICD-10-CM

## 2020-10-14 MED ORDER — LISINOPRIL 20 MG TABLET
ORAL_TABLET | 0 refills | 0 days | Status: CP
Start: 2020-10-14 — End: ?

## 2020-10-19 DIAGNOSIS — G894 Chronic pain syndrome: Principal | ICD-10-CM

## 2020-10-20 ENCOUNTER — Ambulatory Visit
Admit: 2020-10-20 | Discharge: 2020-10-21 | Payer: PRIVATE HEALTH INSURANCE | Attending: Anesthesiology | Primary: Anesthesiology

## 2020-10-20 DIAGNOSIS — M7918 Myalgia, other site: Principal | ICD-10-CM

## 2020-10-20 DIAGNOSIS — H5712 Ocular pain, left eye: Principal | ICD-10-CM

## 2020-10-20 DIAGNOSIS — G8929 Other chronic pain: Principal | ICD-10-CM

## 2020-10-20 DIAGNOSIS — M545 Chronic midline low back pain without sciatica: Principal | ICD-10-CM

## 2020-10-20 DIAGNOSIS — G894 Chronic pain syndrome: Principal | ICD-10-CM

## 2020-10-20 DIAGNOSIS — M79673 Pain in unspecified foot: Principal | ICD-10-CM

## 2020-10-20 MED ORDER — PREGABALIN 100 MG CAPSULE
ORAL_CAPSULE | Freq: Two times a day (BID) | ORAL | 5 refills | 30.00000 days | Status: CP
Start: 2020-10-20 — End: ?

## 2020-11-15 ENCOUNTER — Ambulatory Visit
Admit: 2020-11-15 | Discharge: 2020-11-16 | Payer: PRIVATE HEALTH INSURANCE | Attending: Family Medicine | Primary: Family Medicine

## 2020-11-15 DIAGNOSIS — Z1211 Encounter for screening for malignant neoplasm of colon: Principal | ICD-10-CM

## 2020-11-15 DIAGNOSIS — E1140 Type 2 diabetes mellitus with diabetic neuropathy, unspecified: Principal | ICD-10-CM

## 2020-11-15 DIAGNOSIS — E782 Mixed hyperlipidemia: Principal | ICD-10-CM

## 2020-11-15 DIAGNOSIS — Z1239 Encounter for other screening for malignant neoplasm of breast: Principal | ICD-10-CM

## 2020-11-15 DIAGNOSIS — I10 Essential (primary) hypertension: Principal | ICD-10-CM

## 2020-11-15 MED ORDER — CARVEDILOL 6.25 MG TABLET
ORAL_TABLET | Freq: Two times a day (BID) | ORAL | 0 refills | 90 days | Status: CP
Start: 2020-11-15 — End: 2021-11-15

## 2020-12-06 ENCOUNTER — Ambulatory Visit: Admit: 2020-12-06 | Discharge: 2020-12-07 | Payer: PRIVATE HEALTH INSURANCE

## 2020-12-06 DIAGNOSIS — Z1239 Encounter for other screening for malignant neoplasm of breast: Principal | ICD-10-CM

## 2020-12-07 IMAGING — DX DG CHEST 2V
2 series · 2 of 2 positions shown · non-contrast
Comparison: Chest radiograph dated 10/28/2017

CLINICAL DATA: 39-year-old female with vomiting.

EXAM:
CHEST - 2 VIEW

[chest lat]
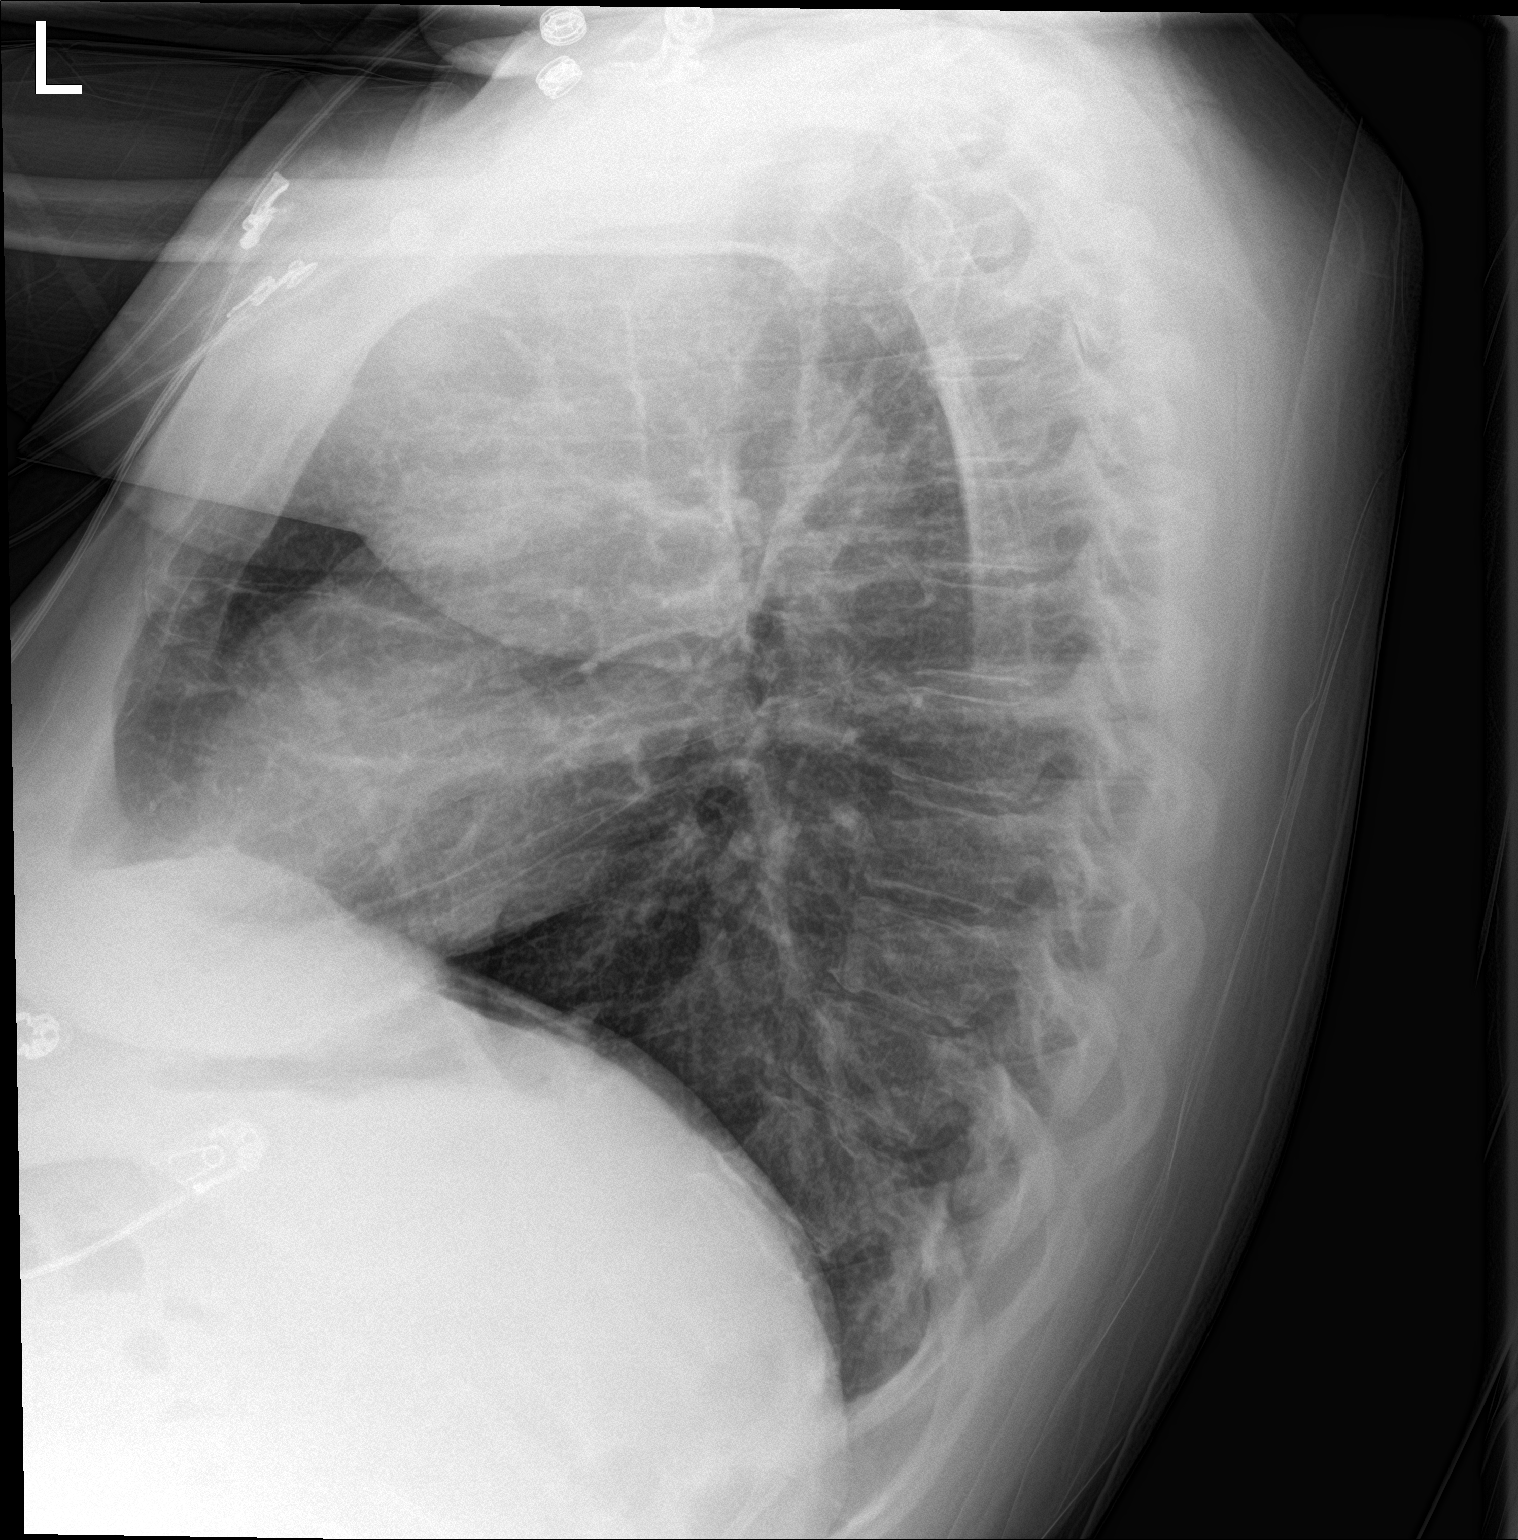

[chest ap]
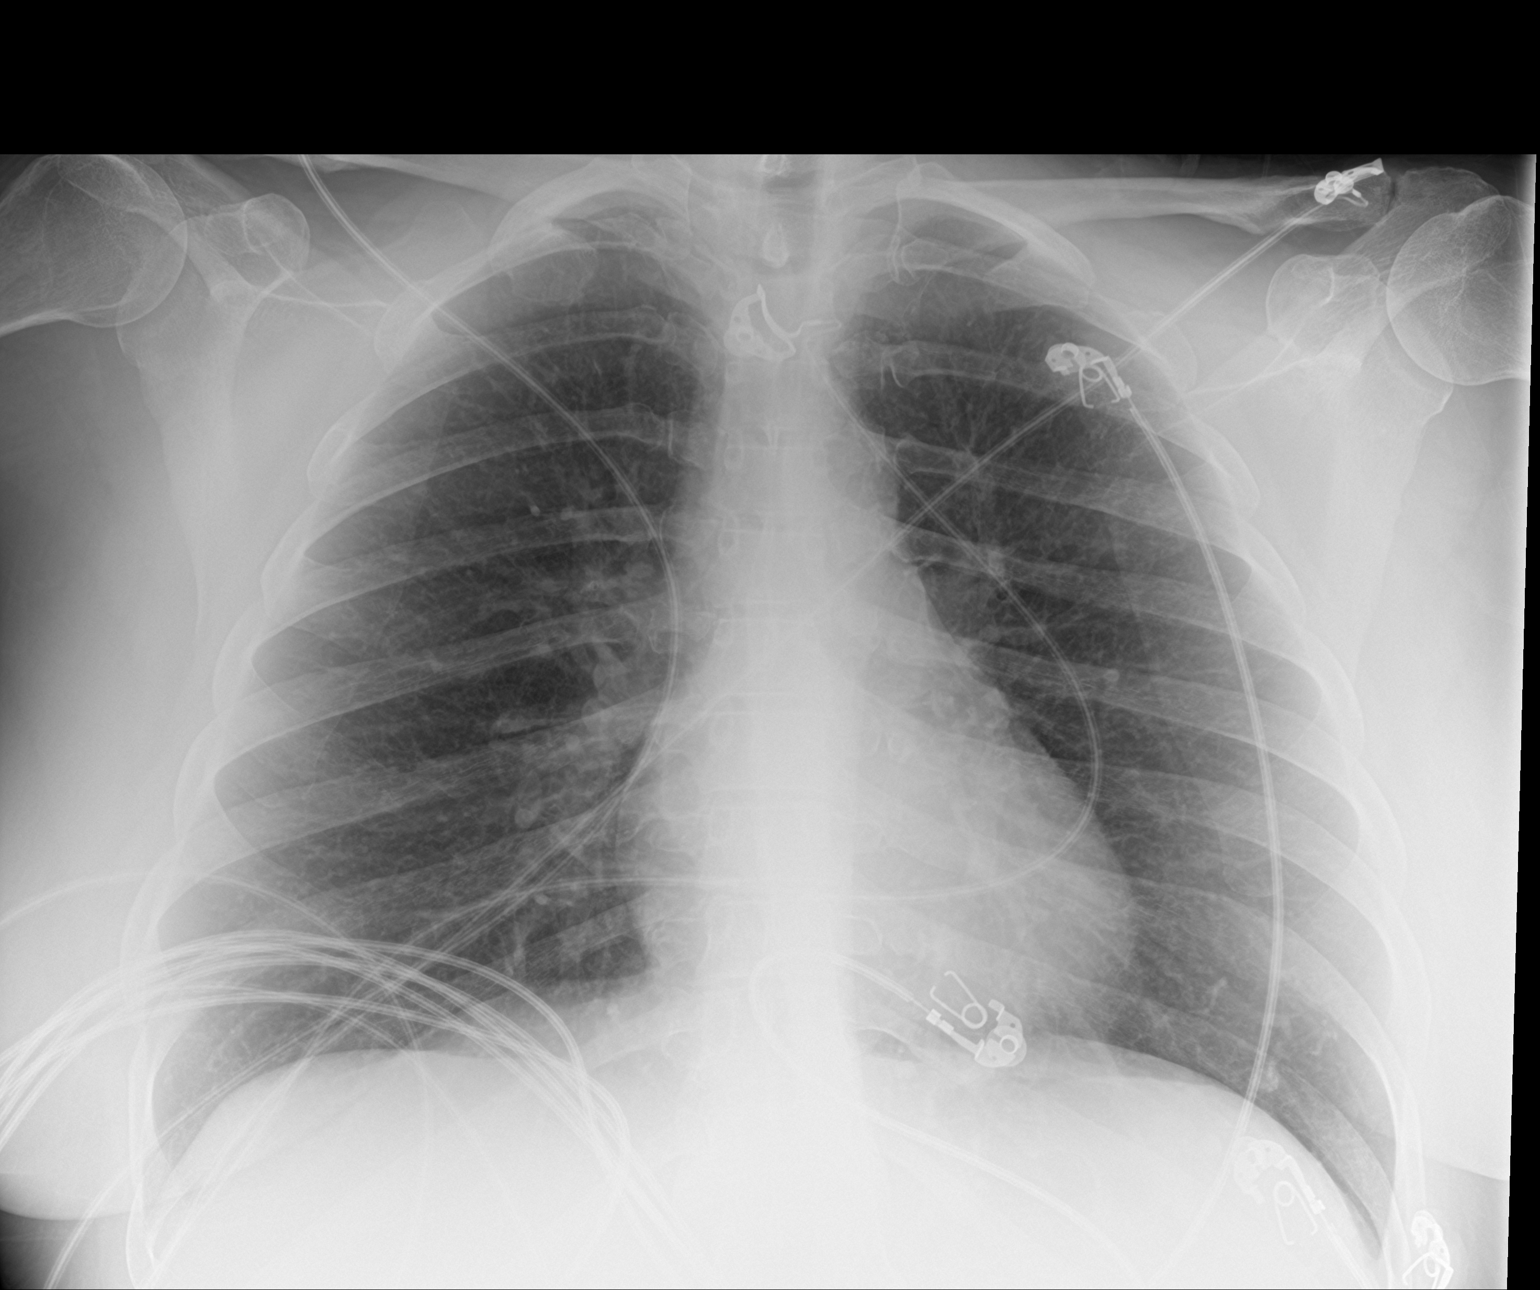

[2 of 2 positions shown; findings below may reference images not displayed]

FINDINGS: The heart size and mediastinal contours are within normal limits.
Both lungs are clear. The visualized skeletal structures are
unremarkable.
IMPRESSION: No active cardiopulmonary disease.

## 2021-01-24 MED ORDER — LISINOPRIL 20 MG TABLET
ORAL_TABLET | 0 refills | 0 days | Status: CP
Start: 2021-01-24 — End: ?

## 2021-02-24 MED ORDER — CARVEDILOL 6.25 MG TABLET
ORAL_TABLET | 0 refills | 0 days | Status: CP
Start: 2021-02-24 — End: ?

## 2021-04-13 ENCOUNTER — Ambulatory Visit
Admit: 2021-04-13 | Discharge: 2021-04-14 | Payer: PRIVATE HEALTH INSURANCE | Attending: Anesthesiology | Primary: Anesthesiology

## 2021-04-13 DIAGNOSIS — M79673 Pain in unspecified foot: Principal | ICD-10-CM

## 2021-04-13 DIAGNOSIS — M25511 Pain in right shoulder: Principal | ICD-10-CM

## 2021-04-13 DIAGNOSIS — M25512 Pain in left shoulder: Principal | ICD-10-CM

## 2021-04-13 DIAGNOSIS — M7918 Myalgia, other site: Principal | ICD-10-CM

## 2021-04-13 DIAGNOSIS — G8929 Other chronic pain: Principal | ICD-10-CM

## 2021-04-13 DIAGNOSIS — G894 Chronic pain syndrome: Principal | ICD-10-CM

## 2021-04-13 MED ORDER — PREGABALIN 100 MG CAPSULE
ORAL_CAPSULE | Freq: Three times a day (TID) | ORAL | 5 refills | 30 days | Status: CP
Start: 2021-04-13 — End: ?

## 2021-05-11 ENCOUNTER — Ambulatory Visit
Admit: 2021-05-11 | Discharge: 2021-05-12 | Payer: PRIVATE HEALTH INSURANCE | Attending: Gastroenterology | Primary: Gastroenterology

## 2021-05-11 DIAGNOSIS — B182 Chronic viral hepatitis C: Principal | ICD-10-CM

## 2021-05-11 DIAGNOSIS — K7460 Unspecified cirrhosis of liver: Principal | ICD-10-CM

## 2021-05-18 ENCOUNTER — Ambulatory Visit: Admit: 2021-05-18 | Discharge: 2021-05-19 | Payer: PRIVATE HEALTH INSURANCE

## 2021-05-18 DIAGNOSIS — F32A Depression, unspecified depression type: Principal | ICD-10-CM

## 2021-05-18 DIAGNOSIS — Z2821 Immunization not carried out because of patient refusal: Principal | ICD-10-CM

## 2021-05-18 DIAGNOSIS — E1140 Type 2 diabetes mellitus with diabetic neuropathy, unspecified: Principal | ICD-10-CM

## 2021-05-18 DIAGNOSIS — I10 Essential (primary) hypertension: Principal | ICD-10-CM

## 2021-05-18 DIAGNOSIS — K219 Gastro-esophageal reflux disease without esophagitis: Principal | ICD-10-CM

## 2021-05-18 DIAGNOSIS — A630 Anogenital (venereal) warts: Principal | ICD-10-CM

## 2021-05-18 MED ORDER — IMIQUIMOD 5 % TOPICAL CREAM PACKET
PACK | 1 refills | 0 days | Status: CP
Start: 2021-05-18 — End: 2022-05-18

## 2021-05-18 MED ORDER — BLOOD-GLUCOSE METER KIT WRAPPER
0 refills | 0 days | Status: CP
Start: 2021-05-18 — End: 2023-06-25

## 2021-05-18 MED ORDER — LISINOPRIL 20 MG TABLET
ORAL_TABLET | Freq: Every day | ORAL | 0 refills | 180 days | Status: CP
Start: 2021-05-18 — End: ?

## 2021-05-18 MED ORDER — OMEPRAZOLE 20 MG CAPSULE,DELAYED RELEASE
ORAL_CAPSULE | Freq: Every day | ORAL | 3 refills | 90 days | Status: CP
Start: 2021-05-18 — End: 2022-05-18

## 2021-05-18 MED ORDER — ESCITALOPRAM 20 MG TABLET
ORAL_TABLET | Freq: Every day | ORAL | 0 refills | 90 days | Status: CP
Start: 2021-05-18 — End: ?

## 2021-05-18 MED ORDER — CARVEDILOL 6.25 MG TABLET
ORAL_TABLET | Freq: Two times a day (BID) | ORAL | 0 refills | 90 days | Status: CP
Start: 2021-05-18 — End: ?

## 2021-06-18 ENCOUNTER — Ambulatory Visit: Admit: 2021-06-18 | Discharge: 2021-06-19 | Payer: PRIVATE HEALTH INSURANCE

## 2021-06-29 ENCOUNTER — Ambulatory Visit
Admit: 2021-06-29 | Discharge: 2021-06-30 | Payer: PRIVATE HEALTH INSURANCE | Attending: Anesthesiology | Primary: Anesthesiology

## 2021-06-29 DIAGNOSIS — M7918 Myalgia, other site: Principal | ICD-10-CM

## 2021-06-29 DIAGNOSIS — M25511 Pain in right shoulder: Principal | ICD-10-CM

## 2021-06-29 DIAGNOSIS — G894 Chronic pain syndrome: Principal | ICD-10-CM

## 2021-06-29 DIAGNOSIS — G8929 Other chronic pain: Principal | ICD-10-CM

## 2021-06-29 DIAGNOSIS — M545 Chronic midline low back pain without sciatica: Principal | ICD-10-CM

## 2021-06-29 DIAGNOSIS — M25512 Pain in left shoulder: Principal | ICD-10-CM

## 2021-06-29 DIAGNOSIS — M79673 Pain in unspecified foot: Principal | ICD-10-CM

## 2021-06-29 MED ORDER — PREGABALIN 100 MG CAPSULE
ORAL_CAPSULE | Freq: Three times a day (TID) | ORAL | 5 refills | 30.00000 days | Status: CP
Start: 2021-06-29 — End: ?

## 2021-08-15 ENCOUNTER — Ambulatory Visit: Admit: 2021-08-15 | Discharge: 2021-08-16 | Payer: PRIVATE HEALTH INSURANCE

## 2021-08-15 DIAGNOSIS — Z8619 Personal history of other infectious and parasitic diseases: Principal | ICD-10-CM

## 2021-08-15 DIAGNOSIS — I8511 Secondary esophageal varices with bleeding: Principal | ICD-10-CM

## 2021-08-15 DIAGNOSIS — K7460 Unspecified cirrhosis of liver: Principal | ICD-10-CM

## 2021-08-18 ENCOUNTER — Ambulatory Visit: Admit: 2021-08-18 | Payer: PRIVATE HEALTH INSURANCE

## 2021-09-08 DIAGNOSIS — I10 Essential (primary) hypertension: Principal | ICD-10-CM

## 2021-09-08 MED ORDER — LISINOPRIL 20 MG TABLET
ORAL_TABLET | Freq: Every day | ORAL | 0 refills | 180 days | Status: CP
Start: 2021-09-08 — End: ?

## 2021-09-09 DIAGNOSIS — I10 Essential (primary) hypertension: Principal | ICD-10-CM

## 2021-09-09 MED ORDER — CARVEDILOL 6.25 MG TABLET
ORAL_TABLET | Freq: Two times a day (BID) | ORAL | 0 refills | 90 days | Status: CP
Start: 2021-09-09 — End: ?

## 2021-12-13 DIAGNOSIS — I10 Essential (primary) hypertension: Principal | ICD-10-CM

## 2021-12-13 MED ORDER — CARVEDILOL 6.25 MG TABLET
ORAL_TABLET | 0 refills | 0 days | Status: CP
Start: 2021-12-13 — End: ?

## 2021-12-18 DIAGNOSIS — I10 Essential (primary) hypertension: Principal | ICD-10-CM

## 2021-12-18 MED ORDER — LISINOPRIL 20 MG TABLET
ORAL_TABLET | 0 refills | 0 days
Start: 2021-12-18 — End: ?

## 2021-12-19 MED ORDER — LISINOPRIL 20 MG TABLET
ORAL_TABLET | Freq: Every day | ORAL | 1 refills | 90 days | Status: CP
Start: 2021-12-19 — End: 2022-12-19

## 2022-01-10 ENCOUNTER — Encounter
Admit: 2022-01-10 | Discharge: 2022-01-10 | Payer: PRIVATE HEALTH INSURANCE | Attending: Registered Nurse | Primary: Registered Nurse

## 2022-01-10 ENCOUNTER — Ambulatory Visit: Admit: 2022-01-10 | Discharge: 2022-01-10 | Payer: PRIVATE HEALTH INSURANCE

## 2022-02-08 ENCOUNTER — Ambulatory Visit: Admit: 2022-02-08 | Discharge: 2022-02-09 | Payer: PRIVATE HEALTH INSURANCE

## 2022-02-08 ENCOUNTER — Ambulatory Visit
Admit: 2022-02-08 | Discharge: 2022-02-09 | Payer: PRIVATE HEALTH INSURANCE | Attending: Anesthesiology | Primary: Anesthesiology

## 2022-02-08 DIAGNOSIS — M7918 Myalgia, other site: Principal | ICD-10-CM

## 2022-02-08 DIAGNOSIS — F32A Depression, unspecified depression type: Principal | ICD-10-CM

## 2022-02-08 DIAGNOSIS — Z8639 Personal history of other endocrine, nutritional and metabolic disease: Principal | ICD-10-CM

## 2022-02-08 DIAGNOSIS — Z139 Encounter for screening, unspecified: Principal | ICD-10-CM

## 2022-02-08 DIAGNOSIS — G894 Chronic pain syndrome: Principal | ICD-10-CM

## 2022-02-08 DIAGNOSIS — K219 Gastro-esophageal reflux disease without esophagitis: Principal | ICD-10-CM

## 2022-02-08 DIAGNOSIS — M79673 Pain in unspecified foot: Principal | ICD-10-CM

## 2022-02-08 DIAGNOSIS — A630 Anogenital (venereal) warts: Principal | ICD-10-CM

## 2022-02-08 DIAGNOSIS — I10 Essential (primary) hypertension: Principal | ICD-10-CM

## 2022-02-08 DIAGNOSIS — E1140 Type 2 diabetes mellitus with diabetic neuropathy, unspecified: Principal | ICD-10-CM

## 2022-02-08 DIAGNOSIS — R102 Pelvic and perineal pain: Principal | ICD-10-CM

## 2022-02-08 DIAGNOSIS — G8929 Other chronic pain: Principal | ICD-10-CM

## 2022-02-08 MED ORDER — OMEPRAZOLE 20 MG CAPSULE,DELAYED RELEASE
ORAL_CAPSULE | Freq: Every day | ORAL | 3 refills | 90 days | Status: CP
Start: 2022-02-08 — End: 2023-02-08

## 2022-02-08 MED ORDER — PREGABALIN 100 MG CAPSULE
ORAL_CAPSULE | Freq: Three times a day (TID) | ORAL | 5 refills | 30 days | Status: CP
Start: 2022-02-08 — End: 2022-02-08

## 2022-02-08 MED ORDER — BUPROPION HCL XL 150 MG 24 HR TABLET, EXTENDED RELEASE
ORAL_TABLET | Freq: Every morning | ORAL | 2 refills | 30 days | Status: CP
Start: 2022-02-08 — End: 2023-02-08

## 2022-02-08 MED ORDER — IMIQUIMOD 5 % TOPICAL CREAM PACKET
PACK | 1 refills | 0 days | Status: CP
Start: 2022-02-08 — End: 2023-02-08

## 2022-02-08 MED ORDER — CARVEDILOL 6.25 MG TABLET
ORAL_TABLET | Freq: Two times a day (BID) | ORAL | 3 refills | 90 days | Status: CP
Start: 2022-02-08 — End: ?

## 2022-02-08 MED ORDER — PREGABALIN 150 MG CAPSULE
ORAL_CAPSULE | Freq: Three times a day (TID) | ORAL | 5 refills | 30 days | Status: CP
Start: 2022-02-08 — End: 2022-08-07

## 2022-02-08 MED ORDER — LISINOPRIL 20 MG TABLET
ORAL_TABLET | Freq: Every day | ORAL | 1 refills | 90 days | Status: CP
Start: 2022-02-08 — End: 2023-02-08

## 2022-02-10 ENCOUNTER — Ambulatory Visit: Admit: 2022-02-10 | Discharge: 2022-02-10 | Payer: PRIVATE HEALTH INSURANCE

## 2022-02-10 ENCOUNTER — Encounter
Admit: 2022-02-10 | Discharge: 2022-02-10 | Payer: PRIVATE HEALTH INSURANCE | Attending: Certified Registered" | Primary: Certified Registered"

## 2022-03-06 MED ORDER — BLOOD GLUCOSE TEST STRIPS
11 refills | 0 days | Status: CP
Start: 2022-03-06 — End: ?

## 2022-04-13 ENCOUNTER — Encounter
Admit: 2022-04-13 | Discharge: 2022-04-13 | Payer: PRIVATE HEALTH INSURANCE | Attending: Anesthesiology | Primary: Anesthesiology

## 2022-04-13 ENCOUNTER — Ambulatory Visit: Admit: 2022-04-13 | Discharge: 2022-04-13 | Payer: PRIVATE HEALTH INSURANCE

## 2022-04-20 ENCOUNTER — Ambulatory Visit: Admit: 2022-04-20 | Payer: PRIVATE HEALTH INSURANCE

## 2022-04-24 ENCOUNTER — Ambulatory Visit
Admit: 2022-04-24 | Discharge: 2022-04-25 | Payer: PRIVATE HEALTH INSURANCE | Attending: Obstetrics & Gynecology | Primary: Obstetrics & Gynecology

## 2022-04-24 DIAGNOSIS — G8929 Other chronic pain: Principal | ICD-10-CM

## 2022-04-24 DIAGNOSIS — R102 Pelvic and perineal pain: Principal | ICD-10-CM

## 2022-04-24 DIAGNOSIS — M6289 Other specified disorders of muscle: Principal | ICD-10-CM

## 2022-05-11 ENCOUNTER — Ambulatory Visit: Admit: 2022-05-11 | Payer: PRIVATE HEALTH INSURANCE

## 2022-07-03 ENCOUNTER — Ambulatory Visit: Admit: 2022-07-03 | Discharge: 2022-07-04 | Payer: MEDICAID | Attending: Internal Medicine | Primary: Internal Medicine

## 2022-07-03 DIAGNOSIS — F32A Depression, unspecified depression type: Principal | ICD-10-CM

## 2022-07-03 DIAGNOSIS — R519 Acute nonintractable headache, unspecified headache type: Principal | ICD-10-CM

## 2022-07-03 DIAGNOSIS — K7469 Other cirrhosis of liver: Principal | ICD-10-CM

## 2022-07-03 DIAGNOSIS — R252 Cramp and spasm: Principal | ICD-10-CM

## 2022-07-03 DIAGNOSIS — R109 Unspecified abdominal pain: Principal | ICD-10-CM

## 2022-07-03 DIAGNOSIS — E1140 Type 2 diabetes mellitus with diabetic neuropathy, unspecified: Principal | ICD-10-CM

## 2022-07-03 MED ORDER — BUPROPION HCL XL 150 MG 24 HR TABLET, EXTENDED RELEASE
ORAL_TABLET | Freq: Every morning | ORAL | 2 refills | 30 days | Status: CP
Start: 2022-07-03 — End: 2023-07-03

## 2022-07-05 DIAGNOSIS — E876 Hypokalemia: Principal | ICD-10-CM

## 2022-07-05 MED ORDER — MAGNESIUM OXIDE 400 MG (241.3 MG MAGNESIUM) TABLET
ORAL_TABLET | Freq: Every day | ORAL | 1 refills | 30 days | Status: CP
Start: 2022-07-05 — End: 2023-07-05

## 2022-07-05 MED ORDER — POTASSIUM CHLORIDE ER 20 MEQ TABLET,EXTENDED RELEASE(PART/CRYST)
ORAL_TABLET | Freq: Every day | ORAL | 1 refills | 30 days | Status: CP
Start: 2022-07-05 — End: 2023-07-05

## 2022-07-11 ENCOUNTER — Ambulatory Visit: Admit: 2022-07-11 | Discharge: 2022-07-12 | Payer: MEDICAID

## 2022-07-11 DIAGNOSIS — F32A Depression, unspecified depression type: Principal | ICD-10-CM

## 2022-07-11 DIAGNOSIS — I10 Essential (primary) hypertension: Principal | ICD-10-CM

## 2022-07-11 DIAGNOSIS — E119 Type 2 diabetes mellitus without complications: Principal | ICD-10-CM

## 2022-07-11 DIAGNOSIS — Z09 Encounter for follow-up examination after completed treatment for conditions other than malignant neoplasm: Principal | ICD-10-CM

## 2022-07-11 MED ORDER — BUPROPION HCL XL 150 MG 24 HR TABLET, EXTENDED RELEASE
ORAL_TABLET | Freq: Every morning | ORAL | 2 refills | 30 days | Status: CP
Start: 2022-07-11 — End: 2023-07-11

## 2022-07-11 MED ORDER — METFORMIN 500 MG TABLET
ORAL_TABLET | Freq: Two times a day (BID) | ORAL | 3 refills | 45 days | Status: CP
Start: 2022-07-11 — End: 2023-07-11

## 2022-07-11 MED ORDER — LISINOPRIL 20 MG TABLET
ORAL_TABLET | Freq: Every day | ORAL | 1 refills | 90 days | Status: CP
Start: 2022-07-11 — End: 2023-07-11

## 2022-07-28 ENCOUNTER — Ambulatory Visit: Admit: 2022-07-28 | Discharge: 2022-07-29 | Payer: MEDICAID | Attending: Clinical | Primary: Clinical

## 2022-07-28 DIAGNOSIS — F32A Depression, unspecified depression type: Principal | ICD-10-CM

## 2022-07-28 DIAGNOSIS — F411 Generalized anxiety disorder: Principal | ICD-10-CM

## 2022-07-28 DIAGNOSIS — F331 Major depressive disorder, recurrent, moderate: Principal | ICD-10-CM

## 2022-08-01 ENCOUNTER — Ambulatory Visit: Admit: 2022-08-01 | Discharge: 2022-08-03 | Payer: MEDICAID

## 2022-08-01 ENCOUNTER — Encounter: Admit: 2022-08-01 | Discharge: 2022-08-03 | Payer: MEDICAID | Attending: Anesthesiology | Primary: Anesthesiology

## 2022-08-01 ENCOUNTER — Encounter: Admit: 2022-08-01 | Discharge: 2022-08-03 | Payer: MEDICAID | Attending: Internal Medicine | Primary: Internal Medicine

## 2022-08-02 DIAGNOSIS — I8511 Secondary esophageal varices with bleeding: Principal | ICD-10-CM

## 2022-08-02 DIAGNOSIS — K766 Portal hypertension: Principal | ICD-10-CM

## 2022-08-02 DIAGNOSIS — K920 Hematemesis: Principal | ICD-10-CM

## 2022-08-02 MED ORDER — PANTOPRAZOLE 40 MG TABLET,DELAYED RELEASE
ORAL_TABLET | Freq: Two times a day (BID) | ORAL | 0 refills | 30 days | Status: CP
Start: 2022-08-02 — End: 2022-09-01
  Filled 2022-08-21: qty 60, 30d supply, fill #0

## 2022-08-02 MED ORDER — RIFAXIMIN 550 MG TABLET
ORAL_TABLET | Freq: Two times a day (BID) | ORAL | 0 refills | 30 days | Status: CP
Start: 2022-08-02 — End: ?
  Filled 2022-08-24: qty 60, 30d supply, fill #0

## 2022-08-03 DIAGNOSIS — K7460 Unspecified cirrhosis of liver: Principal | ICD-10-CM

## 2022-08-09 ENCOUNTER — Ambulatory Visit: Admit: 2022-08-09 | Discharge: 2022-08-10

## 2022-08-09 DIAGNOSIS — R109 Unspecified abdominal pain: Principal | ICD-10-CM

## 2022-08-09 DIAGNOSIS — Z09 Encounter for follow-up examination after completed treatment for conditions other than malignant neoplasm: Principal | ICD-10-CM

## 2022-08-09 DIAGNOSIS — R110 Nausea: Principal | ICD-10-CM

## 2022-08-09 MED ORDER — ONDANSETRON 4 MG DISINTEGRATING TABLET
ORAL_TABLET | Freq: Three times a day (TID) | ORAL | 0 refills | 15 days | Status: CP | PRN
Start: 2022-08-09 — End: ?

## 2022-08-09 MED ORDER — OXYCODONE 5 MG TABLET
ORAL_TABLET | ORAL | 0 refills | 4 days | Status: CP | PRN
Start: 2022-08-09 — End: ?

## 2022-08-18 ENCOUNTER — Ambulatory Visit: Admit: 2022-08-18 | Discharge: 2022-08-19 | Payer: PRIVATE HEALTH INSURANCE | Attending: Clinical | Primary: Clinical

## 2022-08-18 DIAGNOSIS — F331 Major depressive disorder, recurrent, moderate: Principal | ICD-10-CM

## 2022-08-18 DIAGNOSIS — F411 Generalized anxiety disorder: Principal | ICD-10-CM

## 2022-08-18 DIAGNOSIS — K920 Hematemesis: Principal | ICD-10-CM

## 2022-08-21 NOTE — Unmapped (Signed)
[facility] Specialty Medication Onboarding    Specialty Medication: XIFAXAN 550 mg Tab (rifAXIMin)  Prior Authorization: Approved   Financial Assistance: No - copay  <$25  Final Copay/Day Supply: $3 / 30    Insurance Restrictions: None     Notes to Pharmacist:     The triage team has completed the benefits investigation and has determined that the patient is able to fill this medication at [facility]. Please contact the patient to complete the onboarding or follow up with the prescribing physician as needed.
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
## 2022-08-24 NOTE — Unmapped (Signed)
[facility]   Patient Onboarding/Medication Counseling     Plan to dc on rifaximin which needs to be started at least 2 weeks prior to TIPS as a preventative measure as TIPS can lead to hepatic encephalopathy.     Carol Arias is a 51 y.o. female with Xifaxan who I am counseling today on initiation of therapy.  I am speaking to the patient.    Was a Nurse, learning disability used for this call? No    Verified patient's date of birth / HIPAA.    Specialty medication(s) to be sent: Inflammatory Disorders: Xifaxan      Non-specialty medications/supplies to be sent: n/a      Medications not needed at this time: n/a         Xifaxan (rifaximin) 550mg  tablets    Medication & Administration     Dosage:  Preventative measure to protect from HE: 1 tablet (550mg ) BID x 30 days    Administration: Take with or without food.    Adherence/Missed dose instructions:   Take missed dose as soon as you remember. If it is close to the time of your next dose, skip the missed dose and resume with your next scheduled dose.  Do not take extra doses or 2 doses at the same time.    Goals of Therapy     Preventative measure as TIPS can lead to hepatic encephalopathy    Side Effects & Monitoring Parameters     Common Side Effects:   Peripheral edema  Nausea  Dizziness  Fatigue  Ascites    The following side effects should be reported to the provider:  Signs of an allergic reaction, such as rash; hives; itching; red, swollen, blistered, or peeling skin with or without fever. If you have wheezing; tightness in the chest or throat; trouble breathing, swallowing, or talking; unusual hoarseness; or swelling of the mouth, face, lips, tongue, or throat, call 911 or go to the closest emergency department (ED).   Swelling in the arms, legs or stomach.   Feeling very tired or weak.  Low mood (depression).   Fever.   Diarrhea is common with antibiotics. Rarely, a severe form called C diff-associated diarrhea (CDAD) may happen. Sometimes this has led to a deadly bowel problem (colitis). CDAD may happen during or a few months after taking antibiotics. Call your doctor right away if you have stomach pain, cramps, or very loose, watery, or bloody stools. Check with your doctor before treating diarrhea.    Monitoring Parameters:   For the prevention of hepatic encephalopathy:  Patient should monitor for changes in mental status.       Contraindications, Warnings, & Precautions     Superinfection: Prolonged use may result in fungal or bacterial superinfection, including Clostridioides (formerly Clostridium) difficile-associated diarrhea (CDAD) and pseudomembranous colitis; CDAD has been observed >2 months post-antibiotic treatment.  Severe (Child Pugh Class C) Hepatic Impairment: increased systemic exposure with severe hepatic impairment.  Concomitant use with P-glycoprotein (P-gp) inhibitors: P-gp inhibitors may increase systemic exposure of rifaximin.    Drug/Food Interactions     Medication list reviewed in Epic. The patient was instructed to inform the care team before taking any new medications or supplements. No drug interactions identified.   Warfarin: monitor INR and prothrombin time; Dose adjustment of warfarin may be needed to maintain target INR range.    Storage, Handling Precautions, & Disposal     Store this medication at room temperature.  Store in a dry place. Do not  store in a bathroom.   Keep all drugs out of the reach of children and pets.  Throw away unused or expired drugs. Do not flush down a toilet or pour down a drain unless you are told to do so. Check with your pharmacist if you have questions about the best way to throw out drugs. There may be drug take-back programs in your area.      Current Medications (including OTC/herbals), Comorbidities and Allergies     Current Outpatient Medications   Medication Sig Dispense Refill    blood sugar diagnostic (GLUCOSE BLOOD) Strp Check blood sugar before breakfast and as needed. 100 each 11 blood-glucose meter kit Use as instructed 1 each 0    buPROPion (WELLBUTRIN XL) 150 MG 24 hr tablet Take 1 tablet (150 mg total) by mouth every morning. 30 tablet 2    carvediloL (COREG) 6.25 MG tablet Take 1 tablet (6.25 mg total) by mouth Two (2) times a day. 180 tablet 3    lisinopriL (PRINIVIL,ZESTRIL) 20 MG tablet Take 1 tablet (20 mg total) by mouth daily. 90 tablet 1    magnesium oxide 500 mg Tab Take 500 mg by mouth daily.      metFORMIN (GLUCOPHAGE) 500 MG tablet Take 2 tablets (1,000 mg total) by mouth in the morning and 2 tablets (1,000 mg total) in the evening. Take with meals. 180 tablet 3    ondansetron (ZOFRAN-ODT) 4 MG disintegrating tablet Take 1 tablet (4 mg total) by mouth every eight (8) hours as needed for nausea. 45 tablet 0    oxyCODONE (ROXICODONE) 5 MG immediate release tablet Take 1 tablet (5 mg total) by mouth every four (4) hours as needed for pain. 20 tablet 0    pantoprazole (PROTONIX) 40 MG tablet Take 1 tablet (40 mg total) by mouth two (2) times a day. 60 tablet 0    pregabalin (LYRICA) 150 MG capsule Take 1 capsule (150 mg total) by mouth Three (3) times a day. 90 capsule 5    rifAXIMin (XIFAXAN) 550 mg Tab Take 1 tablet (550 mg total) by mouth two (2) times a day. 60 tablet 0     No current facility-administered medications for this visit.       Allergies   Allergen Reactions    Amitriptyline Hallucinations and Confusion     Hallucinations       Patient Active Problem List   Diagnosis    New onset type 2 diabetes mellitus (CMS-HCC)    Mixed hyperlipidemia    Elevated LFTs    Influenza vaccination declined by patient    Health care maintenance    Chronic midline low back pain without sciatica    Chronic hand pain    Chronic foot pain    Hepatomegaly    Lumbar spondylosis    Narcotic addiction (CMS-HCC)    Chronic hepatitis C without hepatic coma (CMS-HCC)    Diabetic peripheral neuropathy associated with type 2 diabetes mellitus (CMS-HCC)    Low serum HDL    Statin intolerance [id number]-60 vaccination declined 05/18/2021    Other cirrhosis of liver (CMS-HCC)    Gastrointestinal hemorrhage with hematemesis    Secondary esophageal varices with bleeding (CMS-HCC)    Tobacco use disorder       Reviewed and up to date in Epic.    Appropriateness of Therapy     Acute infections noted within Epic:  No active infections  Patient reported infection: None    Is medication and dose appropriate based  on diagnosis and infection status? Yes    Prescription has been clinically reviewed: Yes      Baseline Quality of Life Assessment      How many days over the past month did your HE  keep you from your normal activities? For example, brushing your teeth or getting up in the morning. 0    Financial Information     Medication Assistance provided: Prior Authorization    Anticipated copay of $3 reviewed with patient. Verified delivery address.    Delivery Information     Scheduled delivery date: 12/8    Expected start date: 12/8    Medication will be delivered via UPS to the prescription address in [facility].  This shipment will not require a signature.      Explained the services we provide at [facility] and that each month we would call to set up refills.  Stressed importance of returning phone calls so that we could ensure they receive their medications in time each month.  Informed patient that we should be setting up refills 7-10 days prior to when they will run out of medication.  A pharmacist will reach out to perform a clinical assessment periodically.  Informed patient that a welcome packet, containing information about our pharmacy and other support services, a Notice of Privacy Practices, and a drug information handout will be sent.      The patient or caregiver noted above participated in the development of this care plan and knows that they can request review of or adjustments to the care plan at any time.      Patient or caregiver verbalized understanding of the above information as well as how to contact the pharmacy at [phone] option 4 with any questions/concerns.  The pharmacy is open Monday through Friday 8:30am-4:30pm.  A pharmacist is available 24/7 via pager to answer any clinical questions they may have.    Patient Specific Needs     Does the patient have any physical, cognitive, or cultural barriers? No    Does the patient have adequate living arrangements? (i.e. the ability to store and take their medication appropriately) Yes    Did you identify any home environmental safety or security hazards? No    Patient prefers to have medications discussed with  Patient     Is the patient or caregiver able to read and understand education materials at a high school level or above? Yes    Patient's primary language is  English     Is the patient high risk? No    SOCIAL DETERMINANTS OF HEALTH     At the [facility], we have learned that life circumstances - like trouble affording food, housing, utilities, or transportation can affect the health of many of our patients.   That is why we wanted to ask: are you currently experiencing any life circumstances that are negatively impacting your health and/or quality of life? Patient declined to answer    Social Determinants of Health     Financial Resource Strain: Medium Risk (07/28/2022)    Overall Financial Resource Strain (CARDIA)     Difficulty of Paying Living Expenses: Somewhat hard   Internet Connectivity: Not on file   Food Insecurity: No Food Insecurity (07/28/2022)    Hunger Vital Sign     Worried About Running Out of Food in the Last Year: Never true     Ran Out of Food in the Last Year: Never true   Tobacco  Use: High Risk (08/09/2022)    Patient History     Smoking Tobacco Use: Every Day     Smokeless Tobacco Use: Never     Passive Exposure: Not on file   Housing/Utilities: Low Risk  (07/28/2022)    Housing/Utilities     Within the past 12 months, have you ever stayed: outside, in a car, in a tent, in an overnight shelter, or temporarily in someone else's home (i.e. couch-surfing)?: No     Are you worried about losing your housing?: No     Within the past 12 months, have you been unable to get utilities (heat, electricity) when it was really needed?: No   Alcohol Use: Not At Risk (02/08/2022)    Alcohol Use     How often do you have a drink containing alcohol?: 2 - 4 times per month     How many drinks containing alcohol do you have on a typical day when you are drinking?: 3 - 4     How often do you have 5 or more drinks on one occasion?: Never   Transportation Needs: No Transportation Needs (07/28/2022)    PRAPARE - Transportation     Lack of Transportation (Medical): No     Lack of Transportation (Non-Medical): No   Substance Use: Not on file   Health Literacy: Low Risk  (12/25/2020)    Health Literacy     : Never   Physical Activity: Not on file   Interpersonal Safety: Not on file   Stress: Not on file   Intimate Partner Violence: Not on file   Depression: Not at risk (05/18/2021)    PHQ-2     PHQ-2 Score: 2   Social Connections: Not on file       Would you be willing to receive help with any of the needs that you have identified today? Not applicable       Teofilo Pod, PharmD  [facility] Specialty Pharmacist

## 2022-08-24 NOTE — Unmapped (Signed)
Specialty Medication(s): Xifaxan    Carol Arias has been dis-enrolled from the [facility] specialty pharmacy services due to therapy completion - expected therapy completion date: 09/23/22 .    Additional information provided to the patient: n/a    Teofilo Pod, PharmD  [facility] Specialty Pharmacist

## 2022-08-28 MED ORDER — PREGABALIN 150 MG CAPSULE
ORAL_CAPSULE | Freq: Three times a day (TID) | ORAL | 1 refills | 30 days | Status: CP
Start: 2022-08-28 — End: ?

## 2022-09-04 ENCOUNTER — Ambulatory Visit: Admit: 2022-09-04 | Discharge: 2022-09-05 | Payer: PRIVATE HEALTH INSURANCE

## 2022-09-04 DIAGNOSIS — E1140 Type 2 diabetes mellitus with diabetic neuropathy, unspecified: Principal | ICD-10-CM

## 2022-09-04 DIAGNOSIS — Z79899 Other long term (current) drug therapy: Principal | ICD-10-CM

## 2022-09-04 DIAGNOSIS — Z139 Encounter for screening, unspecified: Principal | ICD-10-CM

## 2022-09-04 DIAGNOSIS — R0601 Orthopnea: Principal | ICD-10-CM

## 2022-09-04 DIAGNOSIS — Z23 Encounter for immunization: Principal | ICD-10-CM

## 2022-09-04 DIAGNOSIS — R609 Edema, unspecified: Principal | ICD-10-CM

## 2022-09-04 DIAGNOSIS — F17210 Nicotine dependence, cigarettes, uncomplicated: Principal | ICD-10-CM

## 2022-09-04 MED ORDER — FUROSEMIDE 20 MG TABLET
ORAL_TABLET | Freq: Every day | ORAL | 11 refills | 30 days | Status: CP
Start: 2022-09-04 — End: 2023-09-04

## 2022-09-04 MED ORDER — POTASSIUM CHLORIDE ER 10 MEQ TABLET,EXTENDED RELEASE(PART/CRYST)
ORAL_TABLET | Freq: Every day | ORAL | 11 refills | 30 days | Status: CP
Start: 2022-09-04 — End: 2023-09-04

## 2022-09-04 MED ORDER — MAGNESIUM OXIDE 500 MG TABLET
ORAL_TABLET | Freq: Every day | ORAL | 0 refills | 0 days | Status: CP
Start: 2022-09-04 — End: ?

## 2022-09-08 ENCOUNTER — Encounter
Admit: 2022-09-08 | Discharge: 2022-09-08 | Payer: PRIVATE HEALTH INSURANCE | Attending: Anesthesiology | Primary: Anesthesiology

## 2022-09-08 ENCOUNTER — Ambulatory Visit: Admit: 2022-09-08 | Discharge: 2022-09-08 | Payer: PRIVATE HEALTH INSURANCE

## 2022-09-14 ENCOUNTER — Ambulatory Visit: Admit: 2022-09-14 | Discharge: 2022-09-15 | Payer: PRIVATE HEALTH INSURANCE

## 2022-09-14 DIAGNOSIS — R609 Edema, unspecified: Principal | ICD-10-CM

## 2022-09-14 DIAGNOSIS — R0601 Orthopnea: Principal | ICD-10-CM

## 2022-09-14 MED ORDER — FUROSEMIDE 20 MG TABLET
ORAL_TABLET | Freq: Every day | ORAL | 3 refills | 90 days | Status: CP
Start: 2022-09-14 — End: 2023-09-14

## 2022-09-24 ENCOUNTER — Encounter (HOSPITAL_COMMUNITY): Payer: Self-pay

## 2022-09-28 ENCOUNTER — Ambulatory Visit: Admit: 2022-09-28 | Discharge: 2022-09-29 | Payer: PRIVATE HEALTH INSURANCE

## 2022-10-13 ENCOUNTER — Encounter
Admit: 2022-10-13 | Discharge: 2022-10-14 | Disposition: A | Discharge status: Home / Self Care | Payer: PRIVATE HEALTH INSURANCE | Attending: Anesthesiology

## 2022-10-13 ENCOUNTER — Ambulatory Visit
Admit: 2022-10-13 | Discharge: 2022-10-14 | Disposition: A | Discharge status: Home / Self Care | Payer: PRIVATE HEALTH INSURANCE

## 2022-10-14 MED ORDER — TRAMADOL 50 MG TABLET
ORAL_TABLET | ORAL | 0 refills | 2 days | Status: CP | PRN
Start: 2022-10-14 — End: 2022-10-19

## 2022-10-15 MED ORDER — LEVOFLOXACIN 750 MG TABLET
ORAL_TABLET | Freq: Every day | ORAL | 0 refills | 5 days | Status: CP
Start: 2022-10-15 — End: 2022-10-20

## 2022-10-16 ENCOUNTER — Ambulatory Visit: Admit: 2022-10-16 | Discharge: 2022-10-17 | Payer: PRIVATE HEALTH INSURANCE

## 2022-10-16 DIAGNOSIS — M79643 Pain in unspecified hand: Principal | ICD-10-CM

## 2022-10-16 DIAGNOSIS — M47816 Spondylosis without myelopathy or radiculopathy, lumbar region: Principal | ICD-10-CM

## 2022-10-16 DIAGNOSIS — R630 Anorexia: Principal | ICD-10-CM

## 2022-10-16 DIAGNOSIS — M545 Chronic midline low back pain without sciatica: Principal | ICD-10-CM

## 2022-10-16 DIAGNOSIS — M79673 Pain in unspecified foot: Principal | ICD-10-CM

## 2022-10-16 DIAGNOSIS — G8929 Other chronic pain: Principal | ICD-10-CM

## 2022-10-16 DIAGNOSIS — E1140 Type 2 diabetes mellitus with diabetic neuropathy, unspecified: Principal | ICD-10-CM

## 2022-10-16 DIAGNOSIS — Z95828 Presence of other vascular implants and grafts: Principal | ICD-10-CM

## 2022-10-16 MED ORDER — OXYCODONE 5 MG TABLET
ORAL_TABLET | Freq: Four times a day (QID) | ORAL | 0 refills | 5 days | Status: CP | PRN
Start: 2022-10-16 — End: ?

## 2022-10-17 DIAGNOSIS — B3731 Vaginal candidiasis: Principal | ICD-10-CM

## 2022-10-17 MED ORDER — FLUCONAZOLE 150 MG TABLET
ORAL_TABLET | Freq: Once | ORAL | 0 refills | 1 days | Status: CP
Start: 2022-10-17 — End: 2022-10-17

## 2022-11-01 MED ORDER — PANTOPRAZOLE 40 MG TABLET,DELAYED RELEASE
ORAL_TABLET | ORAL | 0 refills | 0 days | Status: CP
Start: 2022-11-01 — End: ?

## 2022-11-06 ENCOUNTER — Ambulatory Visit: Admit: 2022-11-06 | Discharge: 2022-11-07 | Payer: PRIVATE HEALTH INSURANCE

## 2022-11-09 DIAGNOSIS — K7469 Other cirrhosis of liver: Principal | ICD-10-CM

## 2022-11-09 DIAGNOSIS — K920 Hematemesis: Principal | ICD-10-CM

## 2022-11-09 DIAGNOSIS — K7682 Hepatic encephalopathy (CMS-HCC): Principal | ICD-10-CM

## 2022-11-09 DIAGNOSIS — Z95828 Presence of other vascular implants and grafts: Principal | ICD-10-CM

## 2022-11-09 MED ORDER — LACTULOSE 10 GRAM/15 ML ORAL SOLUTION
Freq: Two times a day (BID) | ORAL | 5 refills | 30 days | Status: CP
Start: 2022-11-09 — End: 2023-11-09

## 2022-11-09 MED ORDER — RIFAXIMIN 550 MG TABLET
ORAL_TABLET | Freq: Two times a day (BID) | ORAL | 5 refills | 30 days | Status: CP
Start: 2022-11-09 — End: ?

## 2022-11-21 ENCOUNTER — Ambulatory Visit: Admit: 2022-11-21 | Discharge: 2022-11-22 | Payer: PRIVATE HEALTH INSURANCE

## 2022-11-21 DIAGNOSIS — Z09 Encounter for follow-up examination after completed treatment for conditions other than malignant neoplasm: Principal | ICD-10-CM

## 2022-11-21 DIAGNOSIS — Z8639 Personal history of other endocrine, nutritional and metabolic disease: Principal | ICD-10-CM

## 2022-11-21 DIAGNOSIS — E1140 Type 2 diabetes mellitus with diabetic neuropathy, unspecified: Principal | ICD-10-CM

## 2022-11-21 DIAGNOSIS — Z1239 Encounter for other screening for malignant neoplasm of breast: Principal | ICD-10-CM

## 2022-11-21 DIAGNOSIS — J449 Chronic obstructive pulmonary disease, unspecified: Principal | ICD-10-CM

## 2022-11-23 ENCOUNTER — Ambulatory Visit: Admit: 2022-11-23 | Discharge: 2022-11-23 | Payer: PRIVATE HEALTH INSURANCE

## 2022-11-23 DIAGNOSIS — I8511 Secondary esophageal varices with bleeding: Principal | ICD-10-CM

## 2022-11-23 DIAGNOSIS — Z8619 Personal history of other infectious and parasitic diseases: Principal | ICD-10-CM

## 2022-11-23 DIAGNOSIS — K7460 Unspecified cirrhosis of liver: Principal | ICD-10-CM

## 2022-12-03 DIAGNOSIS — K7469 Other cirrhosis of liver: Principal | ICD-10-CM

## 2022-12-03 DIAGNOSIS — K7682 Hepatic encephalopathy (CMS-HCC): Principal | ICD-10-CM

## 2022-12-03 DIAGNOSIS — Z95828 Presence of other vascular implants and grafts: Principal | ICD-10-CM

## 2022-12-03 MED ORDER — RIFAXIMIN 550 MG TABLET
ORAL_TABLET | Freq: Two times a day (BID) | ORAL | 5 refills | 30 days | Status: CP
Start: 2022-12-03 — End: ?

## 2022-12-09 ENCOUNTER — Ambulatory Visit: Admit: 2022-12-09 | Discharge: 2022-12-10 | Payer: PRIVATE HEALTH INSURANCE

## 2022-12-15 DIAGNOSIS — K7460 Unspecified cirrhosis of liver: Principal | ICD-10-CM

## 2022-12-21 ENCOUNTER — Ambulatory Visit: Admit: 2022-12-21 | Discharge: 2022-12-21 | Payer: PRIVATE HEALTH INSURANCE

## 2022-12-21 DIAGNOSIS — J449 Chronic obstructive pulmonary disease, unspecified: Principal | ICD-10-CM

## 2022-12-21 DIAGNOSIS — Z8639 Personal history of other endocrine, nutritional and metabolic disease: Principal | ICD-10-CM

## 2022-12-21 DIAGNOSIS — Z1239 Encounter for other screening for malignant neoplasm of breast: Principal | ICD-10-CM

## 2022-12-21 DIAGNOSIS — Z09 Encounter for follow-up examination after completed treatment for conditions other than malignant neoplasm: Principal | ICD-10-CM

## 2022-12-21 DIAGNOSIS — K7460 Unspecified cirrhosis of liver: Principal | ICD-10-CM

## 2022-12-21 DIAGNOSIS — E1140 Type 2 diabetes mellitus with diabetic neuropathy, unspecified: Principal | ICD-10-CM

## 2022-12-26 ENCOUNTER — Ambulatory Visit: Admit: 2022-12-26 | Discharge: 2022-12-27 | Payer: PRIVATE HEALTH INSURANCE

## 2022-12-26 DIAGNOSIS — Z8639 Personal history of other endocrine, nutritional and metabolic disease: Principal | ICD-10-CM

## 2022-12-26 DIAGNOSIS — E1140 Type 2 diabetes mellitus with diabetic neuropathy, unspecified: Principal | ICD-10-CM

## 2022-12-27 ENCOUNTER — Ambulatory Visit
Admit: 2022-12-27 | Discharge: 2022-12-28 | Payer: PRIVATE HEALTH INSURANCE | Attending: Anesthesiology | Primary: Anesthesiology

## 2022-12-27 DIAGNOSIS — G894 Chronic pain syndrome: Principal | ICD-10-CM

## 2022-12-27 DIAGNOSIS — M7918 Myalgia, other site: Principal | ICD-10-CM

## 2022-12-27 DIAGNOSIS — R102 Pelvic and perineal pain: Principal | ICD-10-CM

## 2022-12-27 DIAGNOSIS — G8929 Other chronic pain: Principal | ICD-10-CM

## 2022-12-27 DIAGNOSIS — M545 Chronic midline low back pain without sciatica: Principal | ICD-10-CM

## 2022-12-27 MED ORDER — PREGABALIN 150 MG CAPSULE
ORAL_CAPSULE | Freq: Three times a day (TID) | ORAL | 5 refills | 30 days | Status: CP
Start: 2022-12-27 — End: ?

## 2023-01-06 DIAGNOSIS — K7682 Hepatic encephalopathy (CMS-HCC): Principal | ICD-10-CM

## 2023-01-06 DIAGNOSIS — K7469 Other cirrhosis of liver: Principal | ICD-10-CM

## 2023-01-06 DIAGNOSIS — Z95828 Presence of other vascular implants and grafts: Principal | ICD-10-CM

## 2023-01-06 MED ORDER — LACTULOSE 10 GRAM/15 ML ORAL SOLUTION
0 refills | 0 days
Start: 2023-01-06 — End: ?

## 2023-01-09 MED ORDER — LACTULOSE 10 GRAM/15 ML ORAL SOLUTION
3 refills | 0 days | Status: CP
Start: 2023-01-09 — End: ?

## 2023-01-15 ENCOUNTER — Ambulatory Visit: Admit: 2023-01-15 | Discharge: 2023-01-16 | Payer: PRIVATE HEALTH INSURANCE

## 2023-01-15 DIAGNOSIS — K7460 Unspecified cirrhosis of liver: Principal | ICD-10-CM

## 2023-01-15 DIAGNOSIS — Z87898 Personal history of other specified conditions: Principal | ICD-10-CM

## 2023-01-15 DIAGNOSIS — I10 Essential (primary) hypertension: Principal | ICD-10-CM

## 2023-01-15 DIAGNOSIS — E1140 Type 2 diabetes mellitus with diabetic neuropathy, unspecified: Principal | ICD-10-CM

## 2023-01-19 DIAGNOSIS — E119 Type 2 diabetes mellitus without complications: Principal | ICD-10-CM

## 2023-01-19 MED ORDER — METFORMIN 500 MG TABLET
ORAL_TABLET | 0 refills | 0 days | Status: CP
Start: 2023-01-19 — End: ?

## 2023-01-20 MED ORDER — PANTOPRAZOLE 40 MG TABLET,DELAYED RELEASE
ORAL_TABLET | ORAL | 0 refills | 0 days
Start: 2023-01-20 — End: ?

## 2023-01-22 DIAGNOSIS — R0601 Orthopnea: Principal | ICD-10-CM

## 2023-01-22 DIAGNOSIS — R609 Edema, unspecified: Principal | ICD-10-CM

## 2023-01-22 MED ORDER — FUROSEMIDE 20 MG TABLET
ORAL_TABLET | Freq: Every day | ORAL | 3 refills | 90 days | Status: CP
Start: 2023-01-22 — End: 2024-01-22

## 2023-01-22 MED ORDER — PANTOPRAZOLE 40 MG TABLET,DELAYED RELEASE
ORAL_TABLET | ORAL | 0 refills | 0 days | Status: CP
Start: 2023-01-22 — End: ?

## 2023-01-30 DIAGNOSIS — Z79899 Other long term (current) drug therapy: Principal | ICD-10-CM

## 2023-01-30 DIAGNOSIS — R110 Nausea: Principal | ICD-10-CM

## 2023-01-30 MED ORDER — ONDANSETRON 4 MG DISINTEGRATING TABLET
ORAL_TABLET | Freq: Three times a day (TID) | ORAL | 0 refills | 15 days | PRN
Start: 2023-01-30 — End: ?

## 2023-01-30 MED ORDER — LISINOPRIL 20 MG TABLET
ORAL_TABLET | Freq: Two times a day (BID) | ORAL | 0 refills | 45 days
Start: 2023-01-30 — End: ?

## 2023-01-30 MED ORDER — MAGNESIUM 500 MG (AS MAGNESIUM OXIDE) TABLET
ORAL_TABLET | Freq: Every day | ORAL | 0 refills | 0 days
Start: 2023-01-30 — End: ?

## 2023-01-31 MED ORDER — LISINOPRIL 20 MG TABLET
ORAL_TABLET | Freq: Two times a day (BID) | ORAL | 0 refills | 45 days | Status: CP
Start: 2023-01-31 — End: ?

## 2023-01-31 MED ORDER — ONDANSETRON 4 MG DISINTEGRATING TABLET
ORAL_TABLET | Freq: Three times a day (TID) | ORAL | 0 refills | 15 days | Status: CP | PRN
Start: 2023-01-31 — End: ?

## 2023-01-31 MED ORDER — MAGNESIUM 500 MG (AS MAGNESIUM OXIDE) TABLET
ORAL_TABLET | Freq: Every day | ORAL | 0 refills | 0 days | Status: CP
Start: 2023-01-31 — End: ?

## 2023-02-01 MED ORDER — LISINOPRIL 20 MG TABLET
ORAL_TABLET | ORAL | 0 refills | 0 days
Start: 2023-02-01 — End: ?

## 2023-02-02 ENCOUNTER — Encounter: Admit: 2023-02-02 | Discharge: 2023-02-03 | Payer: PRIVATE HEALTH INSURANCE

## 2023-02-02 DIAGNOSIS — J449 Chronic obstructive pulmonary disease, unspecified: Principal | ICD-10-CM

## 2023-02-02 DIAGNOSIS — F17200 Nicotine dependence, unspecified, uncomplicated: Principal | ICD-10-CM

## 2023-02-02 DIAGNOSIS — R059 Cough, unspecified type: Principal | ICD-10-CM

## 2023-02-21 DIAGNOSIS — F32A Depression, unspecified depression type: Principal | ICD-10-CM

## 2023-02-21 MED ORDER — BUPROPION HCL XL 150 MG 24 HR TABLET, EXTENDED RELEASE
ORAL_TABLET | Freq: Every morning | ORAL | 2 refills | 0 days
Start: 2023-02-21 — End: ?

## 2023-02-22 MED ORDER — BUPROPION HCL XL 150 MG 24 HR TABLET, EXTENDED RELEASE
ORAL_TABLET | Freq: Every morning | ORAL | 2 refills | 30 days | Status: CP
Start: 2023-02-22 — End: ?

## 2023-03-13 ENCOUNTER — Ambulatory Visit: Admit: 2023-03-13 | Payer: PRIVATE HEALTH INSURANCE

## 2023-03-19 ENCOUNTER — Ambulatory Visit: Admit: 2023-03-19 | Discharge: 2023-03-20 | Payer: PRIVATE HEALTH INSURANCE

## 2023-03-19 DIAGNOSIS — Z95828 Presence of other vascular implants and grafts: Principal | ICD-10-CM

## 2023-03-19 DIAGNOSIS — K7469 Other cirrhosis of liver: Principal | ICD-10-CM

## 2023-03-19 DIAGNOSIS — K7682 Hepatic encephalopathy (CMS-HCC): Principal | ICD-10-CM

## 2023-03-19 MED ORDER — RIFAXIMIN 550 MG TABLET
ORAL_TABLET | Freq: Two times a day (BID) | ORAL | 5 refills | 30 days | Status: CP
Start: 2023-03-19 — End: ?
  Filled 2023-03-21: qty 60, 30d supply, fill #0

## 2023-03-19 MED ORDER — LACTULOSE 10 GRAM/15 ML ORAL SOLUTION
Freq: Three times a day (TID) | ORAL | 5 refills | 60 days | Status: CP
Start: 2023-03-19 — End: ?
  Filled 2023-04-02: qty 2700, 30d supply, fill #0
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
## 2023-03-20 LAB — HEPATITIS B SURFACE ANTIBODY
HEPATITIS B SURFACE ANTIBODY QUANT: <8 m[IU]/mL (ref <8.00)
HEPATITIS B SURFACE ANTIBODY: NONREACTIVE

## 2023-03-20 NOTE — Unmapped (Signed)
[facility] Specialty Medication Onboarding    Specialty Medication: XIFAXAN 550 mg Tab (rifAXIMin)  Prior Authorization: Approved   Financial Assistance: No - copay  <$25  Final Copay/Day Supply: $4 / 30    Insurance Restrictions: None     Notes to Pharmacist:   Credit Card on File: no    The triage team has completed the benefits investigation and has determined that the patient is able to fill this medication at [facility]. Please contact the patient to complete the onboarding or follow up with the prescribing physician as needed.

## 2023-03-20 NOTE — Unmapped (Signed)
[facility]   Specialty Lite Counseling    Carol Arias is a 52 y.o. female with Hepatic Encephalopathy who I am counseling today on continuation of therapy.  I am speaking to the patient.    Was a Nurse, learning disability used for this call? No    Verified patient's date of birth / HIPAA.    Specialty Lite medication(s) to be sent: Infectious Disease: Xifaxan      Non-specialty medications/supplies to be sent: lactulose 10 g/73ml      Medications not needed at this time: n/a         An offer to provide counseling to the patient regarding their medication was made. The patient declined counseling      Current Medications (including OTC/herbals), Comorbidities and Allergies     Current Outpatient Medications   Medication Sig Dispense Refill    blood sugar diagnostic (GLUCOSE BLOOD) Strp Check blood sugar before breakfast and as needed. 100 each 11    blood-glucose meter kit Use as instructed 1 each 0    buPROPion (WELLBUTRIN XL) 150 MG 24 hr tablet TAKE 1 TABLET(150 MG) BY MOUTH EVERY MORNING 30 tablet 2    carvediloL (COREG) 6.25 MG tablet Take 1 tablet (6.25 mg total) by mouth Two (2) times a day. 180 tablet 3    furosemide (LASIX) 20 MG tablet Take 1 tablet (20 mg total) by mouth daily. Take an additional 20 mg tablet on Monday, Wednesday, Friday for a total of 40 mg on those days. 90 tablet 3    lactulose 10 gram/15 mL solution Take 30 mL (20 g total) by mouth Three (3) times a day. 5400 mL 5    lisinopril (PRINIVIL,ZESTRIL) 20 MG tablet Take 1 tablet (20 mg total) by mouth two (2) times a day. (Patient not taking: Reported on 03/19/2023) 90 tablet 0    magnesium oxide 500 mg magnesium Tab Take 500 mg by mouth daily. 60 tablet 0    metFORMIN (GLUCOPHAGE) 500 MG tablet TAKE 2 TABLETS EVERY MORNING WITH FOOD AND 2 TABLETS EVERY EVENING WITH FOOD 360 tablet 0    multivitamin (TAB-A-VITE/THERAGRAN) per tablet Take 1 tablet by mouth daily.      ondansetron (ZOFRAN-ODT) 4 MG disintegrating tablet Take 1 tablet (4 mg total) by mouth every eight (8) hours as needed for nausea. 45 tablet 0    pantoprazole (PROTONIX) 40 MG tablet TAKE 1 TABLET BY MOUTH TWICE DAILY 180 tablet 0    potassium chloride 10 MEQ ER tablet Take 1 tablet (10 mEq total) by mouth daily. 30 tablet 11    pregabalin (LYRICA) 150 MG capsule Take 1 capsule (150 mg total) by mouth Three (3) times a day. 90 capsule 5    rifAXIMin (XIFAXAN) 550 mg Tab Take 1 tablet (550 mg total) by mouth two (2) times a day. 60 tablet 5     No current facility-administered medications for this visit.       Allergies   Allergen Reactions    Amitriptyline Hallucinations and Confusion     Hallucinations    Acetaminophen Itching       Patient Active Problem List   Diagnosis    New onset type 2 diabetes mellitus (CMS-HCC)    Mixed hyperlipidemia    Elevated LFTs    Influenza vaccination declined by patient    Health care maintenance    Chronic midline low back pain without sciatica    Chronic hand pain    Chronic foot pain  Hepatomegaly    Lumbar spondylosis    Narcotic addiction (CMS-HCC)    Chronic hepatitis C without hepatic coma (CMS-HCC)    Diabetic peripheral neuropathy associated with type 2 diabetes mellitus (CMS-HCC)    Low serum HDL    Statin intolerance    COVID-19 vaccination declined 05/18/2021    Other cirrhosis of liver (CMS-HCC)    Gastrointestinal hemorrhage with hematemesis    Secondary esophageal varices with bleeding (CMS-HCC)    Tobacco use disorder    S/P TIPS (transjugular intrahepatic portosystemic shunt)    Chronic anemia    COPD (chronic obstructive pulmonary disease) (CMS-HCC)    Essential hypertension    History of intravenous drug abuse       Reviewed and up to date in Epic.    Appropriateness of Therapy     Prescription has been clinically reviewed: Yes    Financial Information     Medication Assistance provided: Prior Authorization    Anticipated copay of $4.00 reviewed with patient.     Patient Specific Needs     Does the patient have any physical, cognitive, or cultural barriers? No    Does the patient have adequate living arrangements? (i.e. the ability to store and take their medication appropriately) Yes    Did you identify any home environmental safety or security hazards? No    Patient prefers to have medications discussed with  Patient     Is the patient or caregiver able to read and understand education materials at a high school level or above? Yes    Patient's primary language is  English     Is the patient high risk? No    SOCIAL DETERMINANTS OF HEALTH     At the [facility] [place] [facility], we have learned that life circumstances - like trouble affording food, housing, utilities, or transportation can affect the health of many of our patients.   That is why we wanted to ask: are you currently experiencing any life circumstances that are negatively impacting your health and/or quality of life? Patient declined to answer    Social Determinants of Health     Financial Resource Strain: Medium Risk (07/28/2022)    Overall Financial Resource Strain (CARDIA)     Difficulty of Paying Living Expenses: Somewhat hard   Internet Connectivity: Not on file   Food Insecurity: No Food Insecurity (07/28/2022)    Hunger Vital Sign     Worried About Running Out of Food in the Last Year: Never true     Ran Out of Food in the Last Year: Never true   Tobacco Use: High Risk (03/19/2023)    Patient History     Smoking Tobacco Use: Every Day     Smokeless Tobacco Use: Never     Passive Exposure: Current   Housing/Utilities: Low Risk  (07/28/2022)    Housing/Utilities     Within the past 12 months, have you ever stayed: outside, in a car, in a tent, in an overnight shelter, or temporarily in someone else's home (i.e. couch-surfing)?: No     Are you worried about losing your housing?: No     Within the past 12 months, have you been unable to get utilities (heat, electricity) when it was really needed?: No   Alcohol Use: Not At Risk (12/26/2022)    Alcohol Use     How often do you have a drink containing alcohol?: Never     How many drinks containing alcohol do you have on a typical day  when you are drinking?: 1 - 2     How often do you have 5 or more drinks on one occasion?: Never   Transportation Needs: No Transportation Needs (12/26/2022)    PRAPARE - Therapist, art (Medical): No     Lack of Transportation (Non-Medical): No   Substance Use: Not on file   Health Literacy: Low Risk  (12/25/2020)    Health Literacy     : Never   Physical Activity: Not on file   Interpersonal Safety: Not on file   Stress: Not on file   Intimate Partner Violence: Not on file   Depression: Not at risk (12/26/2022)    PHQ-2     PHQ-2 Score: 2   Social Connections: Not on file       Would you be willing to receive help with any of the needs that you have identified today? Not applicable    Delivery Information     Verified delivery address.    Scheduled delivery dates:     Xifaxan 550mg : 03/23/23  Lactulose 10g/52ml: 04/03/23    Expected start date: continuation of current therapy    Medication will be delivered via UPS to the prescription address in Epic WAM.  This shipment will not require a signature.      Explained the services we provide at [facility] and that each month we will send text messages and/or mychart messages to set up refills. Informed patient that refills should be scheduled 7-10 days prior to when they will run out of medication. Informed patient that a welcome packet, containing information about our pharmacy and other support services, a Notice of Privacy Practices, and a drug information handout will be sent.      The patient or caregiver noted above participated in the development of this care plan and knows that they can request review of or adjustments to the care plan at any time.      Patient or caregiver verbalized understanding of the above information as well as how to contact the pharmacy at [phone] option 4 with any questions/concerns. The pharmacy is open Monday through Friday 8:30am-4:30pm.  A pharmacist is available 24/7 via pager to answer any clinical questions they may have.      Roderic Palau, PharmD  [facility] Specialty Pharmacist

## 2023-03-23 LAB — PHOSPHATIDYLETHANOL (PETH)
PETH 16:0/18:1 (POPETH) BY LC-MS/MS: <10 ng/mL
PETH 16:0/18:2 (PLPETH) BY LC-MS/MS: <10 ng/mL
PETH INTERPRETATION: NEGATIVE

## 2023-04-10 DIAGNOSIS — E119 Type 2 diabetes mellitus without complications: Principal | ICD-10-CM

## 2023-04-10 MED ORDER — METFORMIN 500 MG TABLET
ORAL_TABLET | 0 refills | 0 days | Status: CP
Start: 2023-04-10 — End: ?

## 2023-04-11 NOTE — Unmapped (Signed)
[facility] Refill Coordination Note    Carol Arias, DOB: September 17, 1971  Phone: [phone] (home)       All above HIPAA information was verified with patient.         04/10/2023     1:07 PM   Specialty Rx Medication Refill Questionnaire   Which Medications would you like refilled and shipped? Xifaxan16 days,Metformin 17 days   Please list all current allergies: Percocet (tylenol), Amnitryptiline   Have you missed any doses in the last 30 days? No   Have you had any changes to your medication(s) since your last refill? No   How many days remaining of each medication do you have at home? 16 days of Xifaxan and 17 days of Metformin   Have you experienced any side effects in the last 30 days? Yes   Please list the medication side effects below. (A pharmacist will reach out to you shortly to discuss your side effects. Note: your medications will not be shipped until we reach out to you). Excessive diarrhea as I have to take extra Metformin to keep sugar levels down.   Please enter the full address (street address, city, state, zip code) where you would like your medication(s) to be delivered to. [address] [place] [address]   Please specify on which day you would like your medication(s) to arrive. Note: if you need your medication(s) within 3 days, please call the pharmacy to schedule your order at [phone]  04/26/2023   Has your insurance changed since your last refill? No   Would you like a pharmacist to call you to discuss your medication(s)? No   Do you require a signature for your package? (Note: if we are billing Medicare Part B or your order contains a controlled substance, we will require a signature) No         Completed refill call assessment today to schedule patient's medication shipment from the [facility] [phone]).  All relevant notes have been reviewed.       Confirmed patient received a Conservation officer, historic buildings and a Surveyor, mining with first shipment. The patient will receive a drug information handout for each medication shipped and additional FDA Medication Guides as required.         REFERRAL TO PHARMACIST     Referral to the pharmacist: Yes - new side effects are reported: Excessive diarrhea as I have to take extra Metformin to keep sugar levels down.Marland Kitchen Refills were scheduled and concern routed (high priority) to pharmacist for evaluation.      SHIPPING     Shipping address confirmed in Epic.     Delivery Scheduled: Yes, Expected medication delivery date: 04/26/2023.     Medication will be delivered via Next Day Courier to the prescription address in Epic WAM.    Dorisann Frames   [facility] Specialty Technician

## 2023-04-14 DIAGNOSIS — I10 Essential (primary) hypertension: Principal | ICD-10-CM

## 2023-04-14 MED ORDER — CARVEDILOL 6.25 MG TABLET
ORAL_TABLET | 3 refills | 0 days
Start: 2023-04-14 — End: ?

## 2023-04-16 MED ORDER — CARVEDILOL 6.25 MG TABLET
ORAL_TABLET | 3 refills | 0 days | Status: CP
Start: 2023-04-16 — End: ?

## 2023-04-16 NOTE — Unmapped (Signed)
Patient is requesting the following refill  Requested Prescriptions     Pending Prescriptions Disp Refills    carvedilol (COREG) 6.25 MG tablet [Pharmacy Med Name: CARVEDILOL 6.25MG  TABLETS] 180 tablet 3     Sig: TAKE 1 TABLET(6.25 MG) BY MOUTH TWICE DAILY       Recent Visits  Date Type Provider Dept   01/15/23 Office Visit Silver, Harvie Junior, FNP [place] [facility]   12/26/22 Office Visit Silver, Harvie Junior, FNP [place] [facility]   11/21/22 Office Visit Silver, Harvie Junior, FNP [place] [facility]   10/16/22 Office Visit Silver, Harvie Junior, FNP [place] [facility]   09/14/22 Office Visit Silver, Harvie Junior, FNP [place] [facility]   09/04/22 Office Visit Silver, Harvie Junior, FNP [place] [facility]   08/18/22 Office Visit Roderic Scarce, [place] Homans, [place] [facility]   08/09/22 Office Visit Silver, Harvie Junior, FNP [place] [facility]   07/28/22 Office Visit Roderic Scarce, Penni Homans, [place] [facility]   07/11/22 Office Visit Silver, Harvie Junior, FNP [place] [facility]   Showing recent visits within past 365 days and meeting all other requirements  Future Appointments  Date Type Provider Dept   05/17/23 Appointment Silver, Harvie Junior, FNP [place] [facility]   Showing future appointments within next 365 days and meeting all other requirements       Labs: Not applicable this refill

## 2023-04-23 MED ORDER — PANTOPRAZOLE 40 MG TABLET,DELAYED RELEASE
ORAL_TABLET | ORAL | 0 refills | 0 days | Status: CP
Start: 2023-04-23 — End: ?

## 2023-04-25 MED FILL — XIFAXAN 550 MG TABLET: ORAL | 30 days supply | Qty: 60 | Fill #1

## 2023-05-17 ENCOUNTER — Ambulatory Visit: Admit: 2023-05-17 | Discharge: 2023-05-18 | Payer: PRIVATE HEALTH INSURANCE

## 2023-05-17 DIAGNOSIS — Z95828 Presence of other vascular implants and grafts: Principal | ICD-10-CM

## 2023-05-17 DIAGNOSIS — F32A Depression, unspecified depression type: Principal | ICD-10-CM

## 2023-05-17 DIAGNOSIS — E119 Type 2 diabetes mellitus without complications: Principal | ICD-10-CM

## 2023-05-17 MED ORDER — GLIPIZIDE ER 2.5 MG TABLET, EXTENDED RELEASE 24 HR
ORAL_TABLET | Freq: Every day | ORAL | 3 refills | 90 days | Status: CP
Start: 2023-05-17 — End: 2024-05-16

## 2023-05-17 MED ORDER — ATORVASTATIN 20 MG TABLET
ORAL | 3 refills | 90 days | Status: CP
Start: 2023-05-17 — End: 2024-05-16

## 2023-05-17 MED ORDER — BUPROPION HCL XL 300 MG 24 HR TABLET, EXTENDED RELEASE
ORAL_TABLET | Freq: Every day | ORAL | 3 refills | 90 days | Status: CP
Start: 2023-05-17 — End: 2024-05-16

## 2023-05-17 NOTE — Unmapped (Addendum)
Stop Carvedilol.     Start Glipizide and continue Metformin for diabetes.    You will hear from [facility] in [place] to schedule your diabetic eye exam.    Start Atorvastatin to decrease your stroke or heart attack risk    Increase Buproprion to 300 mg daily

## 2023-05-17 NOTE — Unmapped (Signed)
[place] [facility] Patient Clinic Note    ASSESSMENT/PLAN:  Problem List Items Addressed This Visit       S/P TIPS (transjugular intrahepatic portosystemic shunt)  Followed by gastroenterology.     Other Visit Diagnoses       Type 2 diabetes mellitus without complication, without long-term current use of insulin (CMS-HCC)    -  Primary  Her A1c was not at goal today at 10.1.  This is an increase from 7.6 four months ago.  Today we discussed adherence to heart healthy diabetic diet and exercise a minimum of 150 minutes weekly.  Additionally, she will continue metformin.  Adding glipizide.  She is also agreeable to start atorvastatin for ASCVD risk associated with diabetes.  Referral to ophthalmology placed for diabetic eye exam.  Questions encouraged and answered.  Understanding verbalized.    Relevant Medications    glipiZIDE (GLUCOTROL XL) 2.5 MG 24 hr tablet    atorvastatin (LIPITOR) 20 MG tablet    Other Relevant Orders    POCT glycosylated hemoglobin (Hb A1C) (Completed)    Ambulatory referral to Ophthalmology    Depression, unspecified depression type      She reports increase in depression and would like to adjust her Wellbutrin.  Increasing Wellbutrin to 300 mg daily.    Relevant Medications    buPROPion (WELLBUTRIN XL) 300 MG 24 hr tablet            Follow-up: 3 months    SUBJECTIVE:  Chief Complaint   Patient presents with    Follow-up    Diabetes     Checking blood sugars at home [phone] recently    Tick Removal     Tick left forearm - she noticed it this am       HPI:  Carol Arias is a 52 y.o. female who presents to clinic today for an established patient visit.       I have reviewed the patients problem list, medical, family, and social histories, current medications, and allergies and updated them as needed.    ROS:  -As above in HPI and the Assessment and Plan.    Health Maintenance:   Health Maintenance   Topic Date Due    Retinal Eye Exam  Never done    DTaP/Tdap/Td Vaccines (1 - Tdap) Never done    Zoster Vaccines (1 of 2) Never done    COVID-19 Vaccine (1 - 2023-24 season) Never done    Foot Exam  02/09/2023    Influenza Vaccine (1) 05/20/2023    Hemoglobin A1c  08/17/2023    Lung Cancer Screening Shared Decision Making  09/29/2023    Urine Albumin/Creatinine Ratio  11/21/2023    Serum Creatinine Monitoring  03/18/2024    Potassium Monitoring  03/18/2024    Mammogram Start Age 43  12/20/2024    Lipid Screening  05/18/2025    COPD Spirometry  02/02/2028    Colon Cancer Screening  02/22/2028    Pneumococcal Vaccine 0-64  Completed       OBJECTIVE:  Vitals:   Vitals:    05/17/23 1051   BP: 101/54   Pulse: 78   Resp: 18   Temp: 35.6 ??C (96 ??F)   SpO2: 96%             05/17/23 76.3 kg (168 lb 3.2 oz)       Physical Exam:  Constitutional: Well-developed, well-nourished, and in no distress.  HENT:   Head: Normocephalic and  atraumatic.   Right Ear: External ear normal.   Left Ear: External ear normal.   Nose: Normal on external review.   Mouth: Normal on external review.  Eyes: Right eye exhibits no discharge. Left eye exhibits no discharge. No scleral icterus.   Neck: Neck supple.   Cardiovascular: Normal rate.   Pulmonary/Chest: Effort normal. No stridor. No respiratory distress.   Abdominal: No abdominal distension.   Musculoskeletal: General: No deformity.   Neurological: Alert and oriented.   Skin: Skin is dry.   Psychiatric: Mood and affect normal.   Vitals reviewed.      [facility] [place]  [phone]  Telephone [phone]  Fax [phone]

## 2023-05-20 ENCOUNTER — Ambulatory Visit: Admit: 2023-05-20 | Discharge: 2023-05-21 | Payer: PRIVATE HEALTH INSURANCE

## 2023-05-20 MED ADMIN — gadopiclenol injection 8 mL: 8 mL | INTRAVENOUS | @ 15:00:00 | Stop: 2023-05-20

## 2023-05-25 NOTE — Unmapped (Signed)
The [facility] has made a second and final attempt to reach this patient to refill the following medication:XIFAXAN.      We have left voicemails on the following phone numbers: [phone], have sent a text message to the following phone numbers: [phone], and have sent a Mychart questionnaire..    Dates contacted: 05/18/23 and 05/25/23  Last scheduled delivery: 04/25/23    The patient may be at risk of non-compliance with this medication. The patient should call the [facility] at [phone]  Option 4, then Option 4: Infectious Disease, Transplant to refill medication.    [name]   [facility] Specialty Technician

## 2023-05-28 MED FILL — XIFAXAN 550 MG TABLET: ORAL | 30 days supply | Qty: 60 | Fill #2

## 2023-05-28 NOTE — Unmapped (Signed)
Update on Xifaxan shipment:     I spoke to Ms. Drinnon and she will now receive her Xifaxan via UPS at the verified prescription address in [place] on 05/29/23.     Corliss Skains. [place], [place].D.  Specialty Pharmacist  [facility]  [phone]

## 2023-05-28 NOTE — Unmapped (Signed)
[facility] Refill Coordination Note    Specialty Lite Medication(s) to be Shipped:   Xifaxan    Other medication(s) to be shipped: No additional medications requested for fill at this time     Carol Arias, DOB: 31-Mar-1971  Phone: [phone] (home)       All above HIPAA information was verified with patient.     Was a Nurse, learning disability used for this call? No    Changes to medications: Carol Arias reports stopping the following medications: coreg  Changes to insurance: No      REFERRAL TO PHARMACIST     Referral to the pharmacist: Not needed      [facility]     Shipping address confirmed in Epic.     Delivery Scheduled: Yes, Expected medication delivery date: 9/11.     Medication will be delivered via UPS to the prescription address in Epic WAM.    Gaspar Cola Shared [facility] Specialty Technician

## 2023-05-28 NOTE — Unmapped (Signed)
Refill coordination for Xifaxan    [facility] has made multiple attempts to reach pt to coordinate refill of Xifaxan to prevent any gaps in treatment.     Reached pt and conference called [facility] to schedule delivery for Wed, 05/30/23.     Park Breed, Pharm D., BCPS, BCGP, CPP  [facility]  [address]  [place], [place] [address]  [phone]

## 2023-06-09 ENCOUNTER — Ambulatory Visit
Admit: 2023-06-09 | Discharge: 2023-06-10 | Disposition: A | Discharge status: Home / Self Care | Payer: PRIVATE HEALTH INSURANCE | Attending: Family Medicine

## 2023-06-09 ENCOUNTER — Emergency Department
Admit: 2023-06-09 | Discharge: 2023-06-10 | Disposition: A | Discharge status: Home / Self Care | Payer: PRIVATE HEALTH INSURANCE | Attending: Family Medicine

## 2023-06-10 NOTE — Unmapped (Signed)
Patient fell on Sunday and rolled her right foot. Patient has swelling and bruising to right foot and ankle

## 2023-06-10 NOTE — Unmapped (Signed)
[facility] EMERGENCY DEPARTMENT ENCOUNTER        CLINICAL IMPRESSION  Final diagnoses:   Closed displaced fracture of fourth metatarsal bone of right foot, initial encounter (Primary)   Closed displaced fracture of fifth metatarsal bone of right foot, initial encounter         Differential Diagnoses, Medical Decision Making, Progress Notes, Procedures and Critical Care      Makhila Paladino is a 52 y.o. female with a past medical history of HTN, COPD, chronic back pain, hepatitis C, upper GI bleed and liver cirrhosis who presents with an injury to the right foot that happened 1 week ago.  Differential diagnoses considered on initial evaluation include but are not limited to fracture, dislocation, contusion.  On exam, patient's right foot is markedly edematous.  She is tender to palpation over the fourth and fifth midshaft metatarsals.  X-ray of the foot shows oblique fractures through the fourth and fifth metatarsals, mildly displaced with no evidence of articular involvement.  There is extensive soft tissue edema.  Patient has been placed in a posterior splint and given crutches.  She is advised to be nonweightbearing although this may be a bit challenging for her due to her underlying health issues.  She also has a walker at home that she may try to use instead.  I placed a referral for her to follow-up with [facility] orthopedics.            Independent Interpretation of Studies    I have independently interpreted the following studies:  EKG:   X-ray(s): X-ray of the foot shows mildly displaced oblique fractures of the fourth and fifth metatarsals.  X-ray of the ankle shows no fracture or dislocation.  CT/MRI(s):       Splint Application    Date/Time: 06/10/2023 12:47 AM    Performed by: Maris Berger, MD  Authorized by: Maris Berger, MD    Consent:     Consent obtained:  Verbal    Consent given by:  Patient    Risks discussed:  Numbness, pain and swelling  Universal protocol:     Patient identity confirmed: Verbally with patient  Pre-procedure details:     Distal neurologic exam:  Normal    Distal perfusion: distal pulses strong and brisk capillary refill    Procedure details:     Location:  Leg    Strapping: no      Splint type:  Short leg    Supplies:  Fiberglass, elastic bandage and cotton padding    Attestation: Splint applied and adjusted personally by me    Post-procedure details:     Distal neurologic exam:  Normal    Distal perfusion: brisk capillary refill      Procedure completion:  Tolerated well, no immediate complications    Post-procedure imaging: not applicable         _______________________________________________________________    Time seen: June 09, 2023 11:59 PM    I have reviewed the triage vital signs and the nursing notes.    CHIEF COMPLAINT    Chief Complaint   Patient presents with    Foot Pain       HPI   Makendra Messimer is a 52 y.o. female with a past medical history of HTN, COPD, chronic back pain, hepatitis C, upper GI bleed and liver cirrhosis who presents with an injury to the right foot that happened 1 week ago.  She was going down some steps and somehow lost her footing.  She turned to the right foot in and then her body weight fell down on it.  Since that time she has been walking on it but it has become increasingly painful and swollen.  She denies injuring herself in any other way during the fall.    PAST MEDICAL HISTORY    Past Medical History:   Diagnosis Date    Chronic back pain 1999    After falling out of 2 story building    COPD (chronic obstructive pulmonary disease) (CMS-HCC) 2010    Hepatitis C     Cured with medication    Hip dislocation, right, initial encounter (CMS-HCC) 1999    After falling out of 2 story building    History of blood transfusion 10/06/2018    Bleeding ulcer, 1 unit pRBCs    Hypertension 2004    Hypokalemia     Mixed hyperlipidemia 09/14/2016    New onset type 2 diabetes mellitus (CMS-HCC) 09/14/2016    Other cirrhosis of liver (CMS-HCC) 06/19/2017    Pneumonia 10/21/2016    RUL w/ sepsis    Upper GI bleed     Mallory Weiss tear noted EGD 10/2017    Vitamin D deficiency        SURGICAL HISTORY    Past Surgical History:   Procedure Laterality Date    APPENDECTOMY  52 yo    EYE SURGERY      Legally blind left eye    HYSTERECTOMY      IR TIPS  10/13/2022    IR TIPS 10/13/2022 Ammie Dalton, MD IMG VIR [facility]    PR UPPER GI ENDOSCOPY,DIAGNOSIS N/A 07/17/2017    Procedure: UGI ENDO, INCLUDE ESOPHAGUS, STOMACH, & DUODENUM &/OR JEJUNUM; DX W/WO COLLECTION SPECIMN, BY BRUSH OR WASH;  Surgeon: Pia Mau, MD;  Location: [facility];  Service: Gastroenterology    PR UPPER GI ENDOSCOPY,DIAGNOSIS N/A 03/25/2019    Procedure: UGI ENDO, INCLUDE ESOPHAGUS, STOMACH, & DUODENUM &/OR JEJUNUM; DX W/WO COLLECTION SPECIMN, BY BRUSH OR WASH;  Surgeon: Pia Mau, MD;  Location: [facility];  Service: Gastroenterology    PR UPPER GI ENDOSCOPY,DIAGNOSIS N/A 02/10/2022    Procedure: UGI ENDO, INCLUDE ESOPHAGUS, STOMACH, & DUODENUM &/OR JEJUNUM; DX W/WO COLLECTION SPECIMN, BY BRUSH OR WASH;  Surgeon: Kennedy Bucker, MD;  Location: [facility];  Service: Gastroenterology    PR UPPER GI ENDOSCOPY,DIAGNOSIS N/A 04/13/2022    Procedure: UGI ENDO, INCLUDE ESOPHAGUS, STOMACH, & DUODENUM &/OR JEJUNUM; DX W/WO COLLECTION SPECIMN, BY BRUSH OR WASH;  Surgeon: Marene Lenz, MD;  Location: [facility];  Service: Gastroenterology    PR UPPER GI ENDOSCOPY,DIAGNOSIS N/A 09/08/2022    Procedure: UGI ENDO, INCLUDE ESOPHAGUS, STOMACH, & DUODENUM &/OR JEJUNUM; DX W/WO COLLECTION SPECIMN, BY BRUSH OR WASH;  Surgeon: Kennedy Bucker, MD;  Location: [facility];  Service: Gastroenterology    PR UPPER GI ENDOSCOPY,LIGAT VARIX N/A 02/25/2019    Procedure: UGI ENDO; Everlene Balls LIG ESOPH &/OR GASTRIC VARICES;  Surgeon: Pia Mau, MD;  Location: [facility];  Service: Gastroenterology    PR UPPER GI ENDOSCOPY,LIGAT VARIX N/A 01/10/2022    Procedure: UGI ENDO; W/BAND LIG ESOPH &/OR GASTRIC VARICES;  Surgeon: Vonda Antigua, MD;  Location: [facility];  Service: Gastroenterology    PR UPPER GI ENDOSCOPY,LIGAT VARIX N/A 08/01/2022    Procedure: UGI ENDO; Everlene Balls LIG ESOPH &/OR GASTRIC VARICES;  Surgeon: Hebert Soho, MD;  Location: [facility];  Service: Gastroenterology    TOTAL ABDOMINAL HYSTERECTOMY  01/1999    Cervical cancer       CURRENT MEDICATIONS    No current facility-administered medications for this encounter.    Current Outpatient Medications:     atorvastatin (LIPITOR) 20 MG tablet, Take 1 tablet (20 mg total) by mouth daily., Disp: 90 tablet, Rfl: 3    blood sugar diagnostic (GLUCOSE BLOOD) Strp, Check blood sugar before breakfast and as needed., Disp: 100 each, Rfl: 11    blood-glucose meter kit, Use as instructed, Disp: 1 each, Rfl: 0    buPROPion (WELLBUTRIN XL) 300 MG 24 hr tablet, Take 1 tablet (300 mg total) by mouth daily., Disp: 90 tablet, Rfl: 3    furosemide (LASIX) 20 MG tablet, Take 1 tablet (20 mg total) by mouth daily. Take an additional 20 mg tablet on Monday, Wednesday, Friday for a total of 40 mg on those days., Disp: 90 tablet, Rfl: 3    glipiZIDE (GLUCOTROL XL) 2.5 MG 24 hr tablet, Take 1 tablet (2.5 mg total) by mouth daily., Disp: 90 tablet, Rfl: 3    lactulose 10 gram/15 mL solution, Take 30 mL (20 g total) by mouth Three (3) times a day., Disp: 5400 mL, Rfl: 5    lisinopril (PRINIVIL,ZESTRIL) 20 MG tablet, Take 1 tablet (20 mg total) by mouth two (2) times a day., Disp: 90 tablet, Rfl: 0    magnesium oxide 500 mg magnesium Tab, Take 500 mg by mouth daily., Disp: 60 tablet, Rfl: 0    metFORMIN (GLUCOPHAGE) 500 MG tablet, TAKE 2 TABLETS BY MOUTH EVERY MORNING AND EVERY EVENING WITH FOOD, Disp: 360 tablet, Rfl: 0    multivitamin (TAB-A-VITE/THERAGRAN) per tablet, Take 1 tablet by mouth daily., Disp: , Rfl:     ondansetron (ZOFRAN-ODT) 4 MG disintegrating tablet, Take 1 tablet (4 mg total) by mouth every eight (8) hours as needed for nausea., Disp: 45 tablet, Rfl: 0    pantoprazole (PROTONIX) 40 MG tablet, TAKE 1 TABLET BY MOUTH TWICE DAILY, Disp: 180 tablet, Rfl: 0    potassium chloride 10 MEQ ER tablet, Take 1 tablet (10 mEq total) by mouth daily., Disp: 30 tablet, Rfl: 11    pregabalin (LYRICA) 150 MG capsule, Take 1 capsule (150 mg total) by mouth Three (3) times a day., Disp: 90 capsule, Rfl: 5    rifAXIMin (XIFAXAN) 550 mg Tab, Take 1 tablet (550 mg total) by mouth two (2) times a day., Disp: 60 tablet, Rfl: 5    ALLERGIES    Allergies   Allergen Reactions    Amitriptyline Hallucinations and Confusion     Hallucinations    Acetaminophen Itching       FAMILY HISTORY    Family History   Problem Relation Age of Onset    Drug abuse Mother     Diabetes Father     Heart attack Father     Uterine cancer Maternal Grandmother     Cancer Paternal Grandmother     Uterine cancer Maternal Aunt     Mental illness Neg Hx     Ovarian cancer Neg Hx     Colon cancer Neg Hx     Breast cancer Neg Hx        SOCIAL HISTORY  Social History     Socioeconomic History    Marital status: Married     Spouse name: None    Number of children: None    Years of  education: None    Highest education level: None   Tobacco Use    Smoking status: Every Day     Current packs/day: 1.00     Average packs/day: 1 pack/day for 30.0 years (30.0 ttl pk-yrs)     Types: Cigarettes, e-Cigarettes     Passive exposure: Current    Smokeless tobacco: Never    Tobacco comments:     1ppd and vapes occasionally; reducing use   Vaping Use    Vaping status: Some Days    Substances: Nicotine    Devices: Disposable   Substance and Sexual Activity    Alcohol use: Not Currently     Comment: once in a blue moon    Drug use: Yes     Types: Marijuana     Comment: uses daily    Sexual activity: Yes     Partners: Male     Comment: husband partner for 23 years.  Husband screened for HCV and negative result.    Social History Narrative    12/03/2017        [facility] Components:        Family, social, cultural characteristics: financial issues - lost insurance  .      Patient has the following communication needs: vision issue     Health Literacy: How confident are you that you understand your health issues/concerns, can participate in your care, and manage your care along with your physician: confident.    Behaviors Affecting Health: none, per patient    Family history of mental health illness and/or substance abuse: none per patient.    Have you been seen by any medical provider that we have not referred you to since your last visit ? No    Discussed a Living Will with the patient and [id number] is under the age of 38.                 Lives in [place] with husband and daughter.     Social Determinants of Health     Financial Resource Strain: Medium Risk (07/28/2022)    Overall Financial Resource Strain (CARDIA)     Difficulty of Paying Living Expenses: Somewhat hard   Food Insecurity: No Food Insecurity (07/28/2022)    Hunger Vital Sign     Worried About Running Out of Food in the Last Year: Never true     Ran Out of Food in the Last Year: Never true   Transportation Needs: No Transportation Needs (12/26/2022)    PRAPARE - Therapist, art (Medical): No     Lack of Transportation (Non-Medical): No         PHYSICAL EXAM      VITAL SIGNS:    ED Triage Vitals [06/09/23 1951]   Enc Vitals Group      BP 159/90      Heart Rate 122      SpO2 Pulse       Resp 16      Temp 36.3 ??C (97.3 ??F)      Temp Source Temporal      SpO2 99 %      Weight 78.9 kg (174 lb)      Height 1.651 m (5' 5)      Head Circumference       Peak Flow       Pain Score       Pain Loc       Pain Education  Exclude from Growth Chart        Constitutional: Alert and oriented. Well appearing and in no distress.  Eyes: Conjunctivae are normal.  ENT       Head: Normocephalic and atraumatic.       Nose: No congestion. Mouth/Throat: Mucous membranes are moist.       Neck: No stridor.  Hematological/Lymphatic/Immunilogical: No cervical lymphadenopathy.  Cardiovascular: Normal rate, regular rhythm.   Respiratory: Normal respiratory effort. Breath sounds are normal.  Gastrointestinal: Soft and nontender. There is no CVA tenderness.  Musculoskeletal: Examination of the right foot reveals significant swelling on the dorsal aspect of the foot.  There is ecchymosis along the base of the fourth and fifth toes.  The foot is extremely tender to palpation.  Dorsalis pedis pulse is palpable with some difficulty.  Sensation is intact in all the toes.  No break in the skin.  No pain with palpation over the medial or lateral malleolus.  Achilles tendon intact.  Neurologic: Normal speech and language. No gross focal neurologic deficits are appreciated.  Skin: Skin is warm, dry and intact. No rash noted.  Psychiatric: Mood and affect are normal. Speech and behavior are normal.      RADIOLOGY    XR Ankle 3 or More Views Right    Result Date: 06/09/2023  EXAM: XR ANKLE 3 OR MORE VIEWS RIGHT DATE: 06/09/2023 9:00 PM ACCESSION: [id number] [facility] DICTATED: 06/09/2023 10:26 PM INTERPRETATION LOCATION: Main Campus CLINICAL INDICATION: 52 years old Female with possible fracture  COMPARISON: None. TECHNIQUE: AP, oblique and lateral views of the right ankle. FINDINGS: No displaced fractures. No dislocation. Soft tissue edema surrounding the ankle. Small effusion.     No acute displaced fractures. Soft tissue edema and small ankle effusion.     XR Foot 3 Or More Views Right    Result Date: 06/09/2023  EXAM: XR FOOT 3 OR MORE VIEWS RIGHT DATE: 06/09/2023 9:01 PM ACCESSION: [id number] [facility] DICTATED: 06/09/2023 10:24 PM INTERPRETATION LOCATION: Main Campus CLINICAL INDICATION: 52 years old Female with injury  COMPARISON: None. TECHNIQUE: Dorsoplantar, oblique and lateral views of the right foot. FINDINGS: Oblique fractures through fourth and fifth metatarsals, mildly displaced. No evidence of intra-articular involvement. Extensive soft tissue edema of the dorsal foot. Nonweightbearing views limit evaluation for Lisfranc injury.     Oblique fractures through fourth and fifth metatarsals, mildly displaced with no evidence of articular involvement. Nonweightbearing views limit evaluation for Lisfranc injury. Extensive soft tissue edema.      LABS  Labs Reviewed - No data to display          ED COURSE  Pertinent labs & imaging results that were available during my care of the patient were reviewed by me (see chart for details).      ATTESTATIONS      Portions of this record have been created using Dragon dictation software. Dictation errors have been sought, but may not have been identified and corrected.       Maris Berger, MD  06/10/23 [phone]

## 2023-06-14 MED ORDER — LISINOPRIL 20 MG TABLET
ORAL_TABLET | ORAL | 0 refills | 0 days
Start: 2023-06-14 — End: ?

## 2023-06-15 MED ORDER — LISINOPRIL 20 MG TABLET
ORAL_TABLET | ORAL | 0 refills | 0 days | Status: CP
Start: 2023-06-15 — End: ?

## 2023-06-19 ENCOUNTER — Ambulatory Visit: Admit: 2023-06-19 | Discharge: 2023-06-20 | Payer: PRIVATE HEALTH INSURANCE

## 2023-06-19 DIAGNOSIS — S92351A Displaced fracture of fifth metatarsal bone, right foot, initial encounter for closed fracture: Principal | ICD-10-CM

## 2023-06-19 DIAGNOSIS — S92341A Displaced fracture of fourth metatarsal bone, right foot, initial encounter for closed fracture: Principal | ICD-10-CM

## 2023-06-19 NOTE — Unmapped (Signed)
SPORTS MEDICINE CONSULTATION VISIT    ASSESSMENT AND PLAN      Diagnosis ICD-10-CM Associated Orders   1. Closed displaced fracture of fourth metatarsal bone of right foot, initial encounter  S92.341A Ambulatory referral to Orthopedic Surgery     XR Foot 3 Or More Views Right     Orthopedics DME Order      2. Closed displaced fracture of fifth metatarsal bone of right foot, initial encounter  S92.351A Ambulatory referral to Orthopedic Surgery     XR Foot 3 Or More Views Right     Orthopedics DME Order           Posterior splint removed. Short walking boot given. WBAT RLE. May take boot off at night and to work on ankle ROM. Educated on icing and elevating. May continue with Rollator or using cane in LUE.     Return for F/U 3-4 weeks, R 4th, 5th MT fx, XR R foot. May discontinue walking boot at that time depending on xrays and exam     Procedure(s):  none      SUBJECTIVE     Chief Complaint:   Chief Complaint   Patient presents with    Foot Injury     right       History of Present Illness: 52 y.o. female who presents for right foot fractures.  DOI: 06/03/2023, missed the bottom of the last step at her house, landed with the foot underneath of the step.  Evaluated [place] [facility] 06/09/2023 x-rays showed fourth and fifth metatarsal shaft fractures, placed in a posterior splint, crutches given which she has had difficulty using, has been borrowing a Rollator.  Localizes pain to the plantar foot over the metatarsal heads. Not taking anything for pain relief.   Accompanied by daughter today in clinic.    Past Medical History:   Past Medical History:   Diagnosis Date    Chronic back pain 1999    After falling out of 2 story building    COPD (chronic obstructive pulmonary disease) (CMS-HCC) 2010    Hepatitis C     Cured with medication    Hip dislocation, right, initial encounter (CMS-HCC) 1999    After falling out of 2 story building    History of blood transfusion 10/06/2018    Bleeding ulcer, 1 unit pRBCs    Hypertension 2004 Hypokalemia     Mixed hyperlipidemia 09/14/2016    New onset type 2 diabetes mellitus (CMS-HCC) 09/14/2016    Other cirrhosis of liver (CMS-HCC) 06/19/2017    Pneumonia 10/21/2016    RUL w/ sepsis    Upper GI bleed     Mallory Weiss tear noted EGD 10/2017    Vitamin D deficiency          OBJECTIVE     Physical Exam:  Vitals:   Wt Readings from Last 3 Encounters:   06/09/23 78.9 kg (174 lb)   05/17/23 76.3 kg (168 lb 3.2 oz)   03/19/23 78.9 kg (174 lb)     Estimated body mass index is 28.96 kg/m?? as calculated from the following:    Height as of 06/09/23: 165.1 cm (5' 5).    Weight as of 06/09/23: 78.9 kg (174 lb).  Gen: Well-appearing female in no acute distress  MSK: Right foot: moderately TTP dorsal foot over 3rd-5th metatarsal shafts and plantar 2nd-5th metatarsal heads. Moderate edema dorsal midfoot extending to the distal toes.  Pain with midfoot motion.  Negative calcaneal squeeze, syndesmosis squeeze. No scars,  rashes or lesions.     Imaging/other tests: 3 views of the right foot today continue to show mildly displaced extra-articular fracture through the distal fourth and fifth metatarsals with mild comminution.  These x-rays compared to x-rays from 06/09/2023 taken in supine position and show no further displacement of the fracture fragments.     @SPORTSPROMIS @      ADMINISTRATIVE     I have personally reviewed and interpreted the images (as available).  Point-of-care ultrasound imaging is on file and stored in a permanent location (if performed).  I have personally reviewed prior records and incorporated relevant information above (as available).    MEDICAL DECISION MAKING (level of service defined by 2/3 elements)     Number/Complexity of Problems Addressed 1 acute complicated injury (99204/99214)   Amount/Complexity of Data to be Reviewed/Analyzed Independent interpretation of a test performed by another physician/other qualified health care professional (99204/99214)   Risk of Complications/Morbidity/Mortality of Management Over-the-counter Medications (99203/99213)     TIME     Total Time for E/M Services on the Date of Encounter [phone] - I personally spent 30-39 minutes face-to-face and non-face-to-face in the care of this patient, excluding time spent during separately reported procedures, but including all pre, intra, and post visit time on the date of service.     CONSULTATION     Consultation services provided YES - Consultation was performed at the request of Merlinda Frederick, to whom my opinion and all services ordered and performed were communicated via written report.     MODIFIER 25 (Significant, Separately Billable Evaluation and Management)     Documentation to ensure appropriate insurance payment for medically necessary work    Per the [employer] (rev. 09/18/2021) Chapter 13, Section B. Evaluation & Management (E&M) Services, paragraph 5:   ???In general, E&M services on the same date of service as the minor surgical procedure are included in the payment for the procedure???However, a significant and separately identifiable E&M service unrelated to the decision to perform the minor surgical procedure is separately reportable with modifier 25. The E&M service and minor surgical procedure do not require different diagnoses.???    Per the American Medical Association in Reporting CPT Modifier 25 [CPT??Geophysicist/field seismologist (Online). 2023;33(11):1-12.] Page 1, Appropriate Use, paragraph 1:   ???Modifier 25 is used to indicate that a patient's condition required a significant, separately identifiable evaluation and management (E/M) service above and beyond that associated with another procedure or service being reported by the same physician or other qualified health care professional [facility]) on the same date. This service must be above and beyond the other service provided or beyond the usual preoperative and postoperative care associated with the procedure or service that was performed on that same date, and it must be substantiated by documentation in the patient's record that satisfies the relevant criteria for the respective E/M service to be reported.???    Per the American Medical Association in Reporting CPT Modifier 25 [CPT??Geophysicist/field seismologist (Online). 2023;33(11):1-12.] Page 2, Considerations, bullet point 2 (Requires awareness of usual preoperative and postoperative services):   ???Pre- and post-operative services typically associated with a procedure include the following and cannot be reported with a separate E/M services code:   Review of patient's relevant past medical history,  Assessment of the problem area to be treated by surgical or other service,  Formulation and explanation of the clinical diagnosis,  Review and explanation of the procedure to the patient, family, or caregiver,  Discussion  of alternative treatments or diagnostic options,  Obtaining informed consent,  Providing postoperative care instructions,  Discussion of any further treatment and follow up after the procedure???    As the service provider for this encounter, I attest that the patient's condition required a significant, separately identifiable, medical necessary evaluation and management service in addition to the procedure performed on the same date of service. The evaluation and management service was above and beyond the usual preoperative and postoperative care associated with the procedure. The specific elements of the encounter that represent evaluation and management service above and beyond the usual preoperative and postoperative care associated with the procedure include, but are not limited to:         PROCEDURES     Procedures     DME     DME ORDER:  Dx: Z61.096E, S92.351A, S92.341A, S92.351A, Closed displaced fracture of fourth metatarsal bone of right foot, initial encounter, Closed displaced fracture of fifth metatarsal bone of right foot, initial encounter, Closed displaced fracture of fourth metatarsal bone of right foot, initial encounter, Closed displaced fracture of fifth metatarsal bone of right foot, initial encounter  Body Location: Foot and Ankle  Foot and Ankle: Walking Boot- Short  Laterality: Right  Weightbearing: Full    DME Lower Extremity, The patient was prescribed this orthosis The patient is ambulatory but has weakness and/or instability of their lower extremity which requires stabilization from this semi-rigid/ rigid orthosis to improve their function.

## 2023-06-19 NOTE — Unmapped (Signed)
Thank you for choosing [facility]!  We appreciate the opportunity to participate in your care.    You will likely be following up with one of the specialty teams if you were seen in [facility].  If any questions or concerns arise after your visit, please do not hesitant to contact me by [facility] or by calling   Clinic staff voicemail line:  [phone]  Main Orthopaedic Appointment Center: [phone]

## 2023-06-27 NOTE — Unmapped (Signed)
[facility] Specialty and Home Delivery Pharmacy Refill Coordination Note    Carol Arias, [place]: 1970-12-29  Phone: [phone] (home)       All above HIPAA information was verified with patient.         06/27/2023    10:22 AM   Specialty Rx Medication Refill Questionnaire   Which Medications would you like refilled and shipped? Zyfaxin ,3days worth left   Please list all current allergies: Amnitryptoline, Tylenol   Have you missed any doses in the last 30 days? No   Have you had any changes to your medication(s) since your last refill? Yes   Please list your medication(s) changes below. Glipizide was added but my liver rejects it. Atorvastatin and it also made me sick as a dog .So I quit them both   How many days remaining of each medication do you have at home? 3days of Zifaxin on hand   Have you experienced any side effects in the last 30 days? Yes   Please list the medication side effects below. (A pharmacist will reach out to you shortly to discuss your side effects. Note: your medications will not be shipped until we reach out to you). Glipizide and Atorvastatin   Please enter the full address (street address, city, state, zip code) where you would like your medication(s) to be delivered to. [address] [place] [address]   Please specify on which day you would like your medication(s) to arrive. Note: if you need your medication(s) within 3 days, please call the pharmacy to schedule your order at [phone]  06/30/2023   Has your insurance changed since your last refill? No   Would you like a pharmacist to call you to discuss your medication(s)? No   Do you require a signature for your package? (Note: if we are billing Medicare Part B or your order contains a controlled substance, we will require a signature) No         Completed refill call assessment today to schedule patient's medication shipment from the [facility] Specialty and Home Delivery Pharmacy [phone]).  All relevant notes have been reviewed.       Confirmed patient received a Conservation officer, historic buildings and a Surveyor, mining with first shipment. The patient will receive a drug information handout for each medication shipped and additional FDA Medication Guides as required.         REFERRAL TO PHARMACIST     Referral to the pharmacist: Not needed      [facility]     Shipping address confirmed in Epic.     Delivery Scheduled: Yes, Expected medication delivery date: 06/29/2023.     Medication will be delivered via UPS to the prescription address in Epic WAM.    [name]   [facility] Specialty and Home Delivery Pharmacy Specialty Technician

## 2023-06-28 MED FILL — XIFAXAN 550 MG TABLET: ORAL | 30 days supply | Qty: 60 | Fill #3

## 2023-07-17 ENCOUNTER — Ambulatory Visit: Admit: 2023-07-17 | Discharge: 2023-07-18 | Payer: PRIVATE HEALTH INSURANCE

## 2023-07-17 DIAGNOSIS — S92341A Displaced fracture of fourth metatarsal bone, right foot, initial encounter for closed fracture: Principal | ICD-10-CM

## 2023-07-17 NOTE — Unmapped (Signed)
 Thank you for choosing [facility]!  We appreciate the opportunity to participate in your care.    You will likely be following up with one of the specialty teams if you were seen in [facility].  If any questions or concerns arise after your visit, please do not hesitant to contact me by [facility] or by calling   Clinic staff voicemail line:  [phone]  Main Orthopaedic Appointment Center: [phone]

## 2023-07-19 DIAGNOSIS — E119 Type 2 diabetes mellitus without complications: Principal | ICD-10-CM

## 2023-07-19 MED ORDER — METFORMIN 500 MG TABLET
ORAL_TABLET | 0 refills | 0 days | Status: CP
Start: 2023-07-19 — End: ?

## 2023-07-19 MED ORDER — PANTOPRAZOLE 40 MG TABLET,DELAYED RELEASE
ORAL_TABLET | ORAL | 0 refills | 0 days | Status: CP
Start: 2023-07-19 — End: ?

## 2023-07-21 NOTE — Unmapped (Signed)
SPORTS MEDICINE RETURN VISIT    ASSESSMENT AND PLAN      Diagnosis ICD-10-CM Associated Orders   1. Closed displaced fracture of fourth metatarsal bone of right foot with routine healing, subsequent encounter  S92.341D XR Foot 3 Or More Views Right      2. Closed displaced fracture of fifth metatarsal bone of right foot with routine healing, subsequent encounter  S92.351D            May discontinue short walking boot.  WBAT RLE.  Home exercises given    Return for F/U 1 month, R 4th, 5th MT fx, XR R foot.  Likely advance activities at that time    Procedure(s):  none      SUBJECTIVE     Chief Complaint:   Chief Complaint   Patient presents with    Right Foot - Follow-up       History of Present Illness: 52 y.o. female who presents for right third and fourth metatarsal fractures.  DOI: 06/03/2023, missed the bottom of the last step at her house, landed with the foot underneath of the step.  Evaluated [place] [facility] 06/09/2023 x-rays showed fourth and fifth metatarsal shaft fractures, placed in a posterior splint, crutches given which she has had difficulty using, has been borrowing a Rollator.    I last saw her in clinic 06/19/2023, discontinue posterior splint, short walking boot given which she wore until 1 week ago.  She has been modifying her gait by walking on the medial aspect of her foot.  Not taking anything for pain relief.     Past Medical History:   Past Medical History:   Diagnosis Date    Chronic back pain 1999    After falling out of 2 story building    COPD (chronic obstructive pulmonary disease) (CMS-HCC) 2010    Hepatitis C     Cured with medication    Hip dislocation, right, initial encounter (CMS-HCC) 1999    After falling out of 2 story building    History of blood transfusion 10/06/2018    Bleeding ulcer, 1 unit pRBCs    Hypertension 2004    Hypokalemia     Mixed hyperlipidemia 09/14/2016    New onset type 2 diabetes mellitus (CMS-HCC) 09/14/2016    Other cirrhosis of liver (CMS-HCC) 06/19/2017 Pneumonia 10/21/2016    RUL w/ sepsis    Upper GI bleed     Mallory Weiss tear noted EGD 10/2017    Vitamin D deficiency      Work/school:    OBJECTIVE     Physical Exam:  Vitals:   Wt Readings from Last 3 Encounters:   06/09/23 78.9 kg (174 lb)   05/17/23 76.3 kg (168 lb 3.2 oz)   03/19/23 78.9 kg (174 lb)     Estimated body mass index is 28.96 kg/m?? as calculated from the following:    Height as of this encounter: 165.1 cm (5' 5).    Weight as of 06/09/23: 78.9 kg (174 lb).  Gen: Well-appearing female in no acute distress  MSK: Right foot: Minimally TTP dorsal foot over 3rd-4th metatarsal shafts, mild edema dorsal midfoot.  No pain with midfoot motion.  Negative calcaneal squeeze, syndesmosis squeeze. No scars, rashes or lesions.  Gait even and steady.  No assistive device    Imaging/other tests: 3 views of the right foot today continue to show mildly displaced extra-articular oblique fractures through the distal fourth and fifth metatarsals with mild comminution, with callus and blending at  the fracture sites.  These x-rays compared to x-rays from 06/19/2023 and show no further displacement of the fracture fragments.     @SPORTSPROMIS @      ADMINISTRATIVE     I have personally reviewed and interpreted the images (as available).  Point-of-care ultrasound imaging is on file and stored in a permanent location (if performed).  I have personally reviewed prior records and incorporated relevant information above (as available).    MEDICAL DECISION MAKING (level of service defined by 2/3 elements)     Number/Complexity of Problems Addressed 1 acute complicated injury (99204/99214)   Amount/Complexity of Data to be Reviewed/Analyzed Independent interpretation of a test performed by another physician/other qualified health care professional (99204/99214)   Risk of Complications/Morbidity/Mortality of Management Over-the-counter Medications (99203/99213)     TIME     Total Time for E/M Services on the Date of Encounter 99213 - I personally spent 20-29 minutes face-to-face and non-face-to-face in the care of this patient, excluding time spent during separately reported procedures, but including all pre, intra, and post visit time on the date of service.     CONSULTATION     Consultation services provided No     MODIFIER 25 (Significant, Separately Billable Evaluation and Management)     Documentation to ensure appropriate insurance payment for medically necessary work    Per the [employer] (rev. 09/18/2021) Chapter 13, Section B. Evaluation & Management (E&M) Services, paragraph 5:   ???In general, E&M services on the same date of service as the minor surgical procedure are included in the payment for the procedure???However, a significant and separately identifiable E&M service unrelated to the decision to perform the minor surgical procedure is separately reportable with modifier 25. The E&M service and minor surgical procedure do not require different diagnoses.???    Per the American Medical Association in Reporting CPT Modifier 25 [CPT??Geophysicist/field seismologist (Online). 2023;33(11):1-12.] Page 1, Appropriate Use, paragraph 1:   ???Modifier 25 is used to indicate that a patient's condition required a significant, separately identifiable evaluation and management (E/M) service above and beyond that associated with another procedure or service being reported by the same physician or other qualified health care professional [facility]) on the same date. This service must be above and beyond the other service provided or beyond the usual preoperative and postoperative care associated with the procedure or service that was performed on that same date, and it must be substantiated by documentation in the patient's record that satisfies the relevant criteria for the respective E/M service to be reported.???    Per the American Medical Association in Reporting CPT Modifier 25 [CPT??Geophysicist/field seismologist (Online). 2023;33(11):1-12.] Page 2, Considerations, bullet point 2 (Requires awareness of usual preoperative and postoperative services):   ???Pre- and post-operative services typically associated with a procedure include the following and cannot be reported with a separate E/M services code:   Review of patient's relevant past medical history,  Assessment of the problem area to be treated by surgical or other service,  Formulation and explanation of the clinical diagnosis,  Review and explanation of the procedure to the patient, family, or caregiver,  Discussion of alternative treatments or diagnostic options,  Obtaining informed consent,  Providing postoperative care instructions,  Discussion of any further treatment and follow up after the procedure???    As the service provider for this encounter, I attest that the patient's condition required a significant, separately identifiable, medical necessary evaluation and management service in addition to the procedure  performed on the same date of service. The evaluation and management service was above and beyond the usual preoperative and postoperative care associated with the procedure. The specific elements of the encounter that represent evaluation and management service above and beyond the usual preoperative and postoperative care associated with the procedure include, but are not limited to:         PROCEDURES     Procedures     DME     DME ORDER:  Dx:  ,

## 2023-08-02 NOTE — Unmapped (Signed)
[facility] Specialty and Home Delivery Pharmacy Refill Coordination Note    Specialty Lite Medication(s) to be Shipped:   Xifaxan    Other medication(s) to be shipped: No additional medications requested for fill at this time     Carol Arias, DOB: 1971-02-16  Phone: [phone] (home)       All above HIPAA information was verified with patient.     Was a Nurse, learning disability used for this call? No    Changes to medications: [name] reports no changes at this time.  Changes to insurance: No      REFERRAL TO PHARMACIST     Referral to the pharmacist: Not needed      [facility]     Shipping address confirmed in Epic.     Delivery Scheduled: Yes, Expected medication delivery date: 08/06/23.     Medication will be delivered via UPS to the prescription address in Epic WAM.    [name]   [facility] Specialty and Home Delivery Pharmacy Specialty Technician

## 2023-08-03 MED FILL — XIFAXAN 550 MG TABLET: ORAL | 30 days supply | Qty: 60 | Fill #4

## 2023-08-20 ENCOUNTER — Telehealth: Admit: 2023-08-20 | Discharge: 2023-08-21 | Payer: PRIVATE HEALTH INSURANCE

## 2023-08-20 DIAGNOSIS — I10 Essential (primary) hypertension: Principal | ICD-10-CM

## 2023-08-20 DIAGNOSIS — E119 Type 2 diabetes mellitus without complications: Principal | ICD-10-CM

## 2023-08-20 DIAGNOSIS — R110 Nausea: Principal | ICD-10-CM

## 2023-08-20 DIAGNOSIS — Z79899 Other long term (current) drug therapy: Principal | ICD-10-CM

## 2023-08-20 MED ORDER — LISINOPRIL 20 MG TABLET
ORAL_TABLET | Freq: Every day | ORAL | 1 refills | 180 days | Status: CP
Start: 2023-08-20 — End: ?

## 2023-08-20 MED ORDER — POTASSIUM CHLORIDE ER 10 MEQ TABLET,EXTENDED RELEASE(PART/CRYST)
ORAL_TABLET | Freq: Every day | ORAL | 11 refills | 30 days | Status: CP
Start: 2023-08-20 — End: 2024-08-19

## 2023-08-20 MED ORDER — ONDANSETRON 4 MG DISINTEGRATING TABLET
ORAL_TABLET | Freq: Three times a day (TID) | ORAL | 0 refills | 15 days | Status: CP | PRN
Start: 2023-08-20 — End: ?

## 2023-08-20 MED ORDER — METFORMIN 500 MG TABLET
ORAL_TABLET | Freq: Two times a day (BID) | ORAL | 0 refills | 90 days | Status: CP
Start: 2023-08-20 — End: ?

## 2023-08-20 MED ORDER — PANTOPRAZOLE 40 MG TABLET,DELAYED RELEASE
ORAL_TABLET | Freq: Every day | ORAL | 0 refills | 180 days | Status: CP
Start: 2023-08-20 — End: ?

## 2023-08-20 NOTE — Unmapped (Signed)
Video Visit Note  This medical encounter was conducted virtually using [email or web address]  TeleHealth protocols.    I have identified myself to the patient and conveyed my credentials to Carol Arias  Patient/proxy has provided verbal consent to proceed with this video visit.  In case we get disconnected, [phone] (home)   In case of Emergency, patient's current location: Home: [address]  [place] [address]  Is there someone else in the room? No.     Carol Arias is a 52 y.o. female that presents for a video visit today regarding the following issues:    Assessment/Plan:      Problem List Items Addressed This Visit       Essential hypertension (Chronic)  She reports her blood pressure has been at goal.  She denies chest pain or pressure, lightheadedness or dizziness, shortness of breath, headache.  She will continue current regimen.  Updating CMP in clinic.    Relevant Medications    lisinopril (PRINIVIL,ZESTRIL) 20 MG tablet    Other Relevant Orders    Comprehensive metabolic panel     Other Visit Diagnoses         Type 2 diabetes mellitus without complication, without long-term current use of insulin (CMS-HCC)    -  Primary  Carol Arias reports that she has been intolerant of glipizide due to significant GI upset which she believes could be contributable to her liver disease.  She has stopped taking glipizide.  Her A1c 3 months ago was 10.1.  Advised referral to endocrinology.  Appropriate order placed.  Additionally, she will come into the clinic for a lab visit to update her A1c and CMP.      Relevant Medications    metFORMIN (GLUCOPHAGE) 500 MG tablet    Other Relevant Orders    Hemoglobin A1c    Endocrinology    Comprehensive metabolic panel      Long term current use of diuretic        Relevant Medications    potassium chloride 10 MEQ ER tablet      Nausea        Relevant Medications    ondansetron (ZOFRAN-ODT) 4 MG disintegrating tablet          Followup:  No follow-ups on file.      The patient reports they are physically located in [place] and is currently: at home. I conducted a phone visit.  I spent 20 minutes on the phone call with the patient on the date of service .         Subjective:     HPI  Carol Arias is a 52 y.o. female requesting a video visit to discuss the following issues:        ROS  As per HPI.    HISTORY  I have reviewed the patient's problem list, current medications, and allergies and have updated/reconciled them as needed.    Objective:   Patient Reported Blood Pressure Readings    General: Well appearing , in no acute distress  Skin: Color is normal. No rashes.  Psych: Appropriate affect, normal mood  Resp: Normal work of breathing, no retractions

## 2023-08-23 MED ORDER — PREGABALIN 150 MG CAPSULE
ORAL_CAPSULE | Freq: Three times a day (TID) | ORAL | 5 refills | 30 days | Status: CN
Start: 2023-08-23 — End: ?

## 2023-08-23 NOTE — Unmapped (Signed)
Patient is requesting the following refill  Requested Prescriptions     Pending Prescriptions Disp Refills    pregabalin (LYRICA) 150 MG capsule 90 capsule 5     Sig: Take 1 capsule (150 mg total) by mouth Three (3) times a day.       Recent Visits  Date Type Provider Dept   08/20/23 Telemedicine Silver, Harvie Junior, FNP [place] [facility]   05/17/23 Office Visit Silver, Harvie Junior, FNP [place] [facility]   01/15/23 Office Visit Silver, Harvie Junior, FNP [place] [facility]   12/26/22 Office Visit Silver, Harvie Junior, FNP [place] [facility]   11/21/22 Office Visit Silver, Harvie Junior, FNP [place] [facility]   10/16/22 Office Visit Silver, Harvie Junior, FNP [place] [facility]   09/14/22 Office Visit Silver, Harvie Junior, FNP [place] [facility]   09/04/22 Office Visit Silver, Harvie Junior, FNP [place] [facility]   Showing recent visits within past 365 days and meeting all other requirements  Future Appointments  Date Type Provider Dept   11/20/23 Appointment Silver, Harvie Junior, FNP [place] [facility]   Showing future appointments within next 365 days and meeting all other requirements       Labs: Not applicable this refill

## 2023-08-24 ENCOUNTER — Ambulatory Visit: Admit: 2023-08-24 | Payer: PRIVATE HEALTH INSURANCE

## 2023-09-05 ENCOUNTER — Ambulatory Visit: Admit: 2023-09-05 | Payer: PRIVATE HEALTH INSURANCE

## 2023-09-06 DIAGNOSIS — K7682 Hepatic encephalopathy (CMS-HCC): Principal | ICD-10-CM

## 2023-09-06 DIAGNOSIS — K7469 Other cirrhosis of liver: Principal | ICD-10-CM

## 2023-09-06 DIAGNOSIS — Z95828 Presence of other vascular implants and grafts: Principal | ICD-10-CM

## 2023-09-06 MED FILL — XIFAXAN 550 MG TABLET: ORAL | 30 days supply | Qty: 60 | Fill #5

## 2023-09-06 NOTE — Unmapped (Signed)
[facility] Specialty and Home Delivery Pharmacy Refill Coordination Note    Specialty Lite Medication(s) to be Shipped:   Xifaxan    Other medication(s) to be shipped: No additional medications requested for fill at this time     Carol Arias, DOB: 07-09-1971  Phone: [phone] (home)       All above HIPAA information was verified with patient.     Was a Nurse, learning disability used for this call? No    Changes to medications: [name] reports no changes at this time.  Changes to insurance: No      REFERRAL TO PHARMACIST     Referral to the pharmacist: Not needed      [facility]     Shipping address confirmed in Epic.     Delivery Scheduled: Yes, Expected medication delivery date: 09/07/2023.     Medication will be delivered via UPS to the prescription address in Epic WAM.    [name]   [facility] Specialty and Home Delivery Pharmacy Specialty Technician

## 2023-09-24 ENCOUNTER — Ambulatory Visit: Admit: 2023-09-24 | Discharge: 2023-09-24 | Payer: PRIVATE HEALTH INSURANCE

## 2023-09-24 DIAGNOSIS — I10 Essential (primary) hypertension: Principal | ICD-10-CM

## 2023-09-24 DIAGNOSIS — Z95828 Presence of other vascular implants and grafts: Principal | ICD-10-CM

## 2023-09-24 DIAGNOSIS — K7682 Hepatic encephalopathy (CMS-HCC): Principal | ICD-10-CM

## 2023-09-24 DIAGNOSIS — K7469 Other cirrhosis of liver: Principal | ICD-10-CM

## 2023-09-24 DIAGNOSIS — E119 Type 2 diabetes mellitus without complications: Principal | ICD-10-CM

## 2023-09-24 LAB — COMPREHENSIVE METABOLIC PANEL
ALBUMIN: 2.6 g/dL — ABNORMAL LOW (ref 3.4–5.0)
ALKALINE PHOSPHATASE: 130 U/L — ABNORMAL HIGH (ref 46–116)
ALT (SGPT): 33 U/L (ref 10–49)
ANION GAP: 12 mmol/L (ref 5–14)
AST (SGOT): 35 U/L — ABNORMAL HIGH (ref <=34)
BILIRUBIN TOTAL: 0.7 mg/dL (ref 0.3–1.2)
BLOOD UREA NITROGEN: 6 mg/dL — ABNORMAL LOW (ref 9–23)
BUN / CREAT RATIO: 13
CALCIUM: 9.1 mg/dL (ref 8.7–10.4)
CHLORIDE: 98 mmol/L (ref 98–107)
CO2: 26.8 mmol/L (ref 20.0–31.0)
CREATININE: 0.48 mg/dL — ABNORMAL LOW (ref 0.55–1.02)
EGFR CKD-EPI (2021) FEMALE: >90 mL/min/{1.73_m2} (ref >=60)
GLUCOSE RANDOM: 367 mg/dL — ABNORMAL HIGH (ref 70–179)
POTASSIUM: 3.8 mmol/L (ref 3.4–4.8)
PROTEIN TOTAL: 7.7 g/dL (ref 5.7–8.2)
SODIUM: 137 mmol/L (ref 135–145)

## 2023-09-24 LAB — CBC
HEMATOCRIT: 30 % — ABNORMAL LOW (ref 34.0–44.0)
HEMOGLOBIN: 9.3 g/dL — ABNORMAL LOW (ref 11.3–14.9)
MEAN CORPUSCULAR HEMOGLOBIN CONC: 31.1 g/dL — ABNORMAL LOW (ref 32.0–36.0)
MEAN CORPUSCULAR HEMOGLOBIN: 24.4 pg — ABNORMAL LOW (ref 25.9–32.4)
MEAN CORPUSCULAR VOLUME: 78.5 fL (ref 77.6–95.7)
MEAN PLATELET VOLUME: 8.9 fL (ref 6.8–10.7)
PLATELET COUNT: 62 10*9/L — ABNORMAL LOW (ref 150–450)
RED BLOOD CELL COUNT: 3.81 10*12/L — ABNORMAL LOW (ref 3.95–5.13)
RED CELL DISTRIBUTION WIDTH: 18.7 % — ABNORMAL HIGH (ref 12.2–15.2)
WBC ADJUSTED: 2.4 10*9/L — ABNORMAL LOW (ref 3.6–11.2)

## 2023-09-24 LAB — AFP TUMOR MARKER: AFP-TUMOR MARKER: 3 ng/mL (ref <=8)

## 2023-09-24 LAB — HEMOGLOBIN A1C
ESTIMATED AVERAGE GLUCOSE: 275 mg/dL
HEMOGLOBIN A1C: 11.2 % — ABNORMAL HIGH (ref 4.8–5.6)

## 2023-09-24 LAB — PROTIME-INR
INR: 1.31
PROTIME: 14.9 s — ABNORMAL HIGH (ref 9.9–12.6)

## 2023-09-24 MED ORDER — LACTULOSE 10 GRAM/15 ML ORAL SOLUTION
Freq: Three times a day (TID) | ORAL | 5 refills | 60.00 days | Status: CP
Start: 2023-09-24 — End: 2023-09-24

## 2023-09-24 NOTE — Unmapped (Addendum)
Great to see you   --labs today   --MRI abdomen and pelvis ( to look for course of pelvic pain and screening for liver screen)  Ordered for [facility] ( not due until March 2025   - hep b shot today and next dose next visit   --increase protein to 90 grams of protein daily    Call  my nurse Lanora Manis with any concerns or questions  [phone]    Or message me in [employer]

## 2023-09-24 NOTE — Progress Notes (Addendum)
 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 Developed EV bleed in 2020. S/p TIPS Jan 2024. First episode HE 2024 with two back to back hospital visits at [facility] in February (possibly precipitated by unintentional amphetamine use)  HCV: s/p Harvoni  with SVR (2019).  HE: continue Xifaxan  550 bid and lactulose  TID. Discussed importance of avoiding illicit drugs as this can precipitate confusion/delirium.   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 Now s/p TIPS January 2024. Was on carvedilol  6.25 mg for BP, was taken off   HCC: q25month surveillance discussed. Last MRI 05/2023 without mass, patent TIPS.  Prior scattered LR-3 lesions were not seen.F/u. MRI due in March 2025 (can get Abd/ Pelvis to look for hernia? Source of pelvic pain) Ordered at [facility]   Nutrition: discussed increasing protein to help with low alb, discussed protein shakes, bedtime snack  TIPS: MRI 05/20/2023--> showed TIPS patency. Can Doppler for any issues (ascites, bleeding etc) MRI due in 11/2023 at [facility] --> doppler with high velocites, TIPS revision on 07/17/2024 lower gradient from 5--> 3  BLE edema: could be from hypoalbumemia and sequelae of portal HTN. lasix  20 mg daily, has been taking 40 mg  M,W,F--> per PCP,  we discussed increasing protein to help with BLE,   --could add 50 mg spiro (messaged PCP)    Health maintenance: HAV immune. S/p HBV vaccine - no immunity revaccinate with heplisav first dose today, second dose next visit  . Colonoscopy done 02/2018 negative, next due 10 years.  Pelvic /Lower abdominal pain: could be related to inguinal hernia from lifting grandaughter, given ED precautions regarding hernia  --include pelvis in MRI abdomen in march   OLT: lowish MELD, would need counseling/abstinence for substance use   Substance Use: discussed complete avoidance of drugs/ alcohol. No safe amt in [facility]. Wants to hold on [facility] referral right now d/t instability of transportation. Also suggested can be seen virtually, she will consider   Uses MyChart? No    RTC in  6 months     It was my pleasure caring for this patient. Please reach out with any questions.     Carol Arias. Carol Arias, ANP- [facility]  [name]  [place] [address]  [phone]     Subjective      Brief HPI:  Carol Arias is a 53 y.o. female who is here for follow-up.    Interval History (since last visit on 03/2023)  Doing okay. No liver events.   Living with her daughter  No hospital visits.   HE controlled with  lactulose  2-3  times daily + xifaxan .   PCP took her off carvedilol  bc BP was at goal   Has pelvic pain, thinks has inguinal hernia and has been worse since carrying grand daughter    No new sexual partners        Objective   Physical Exam         Constitutional: She is in no apparent distress.   Eyes: Anicteric sclerae.   Cardiovascular: +2, +3  peripheral edema.   Gastrointestinal: Soft, nontender abdomen without hepatosplenomegaly, hernias or masses. Tenderness on palpation in pelvic region   Neurologic: Nonfocal, no asterixis.   Psychiatric: Alert and oriented to person, place and time. Normal affect.  Labs:    Appointment on 09/24/2023   Component Date Value Ref Range Status    Hemoglobin A1C 09/24/2023 11.2 (H)  4.8 - 5.6 % Final    Estimated Average Glucose 09/24/2023 275  mg/dL Final    Sodium 98/93/7974 137  135 - 145 mmol/L Final    Potassium 09/24/2023 3.8  3.4 - 4.8 mmol/L Final    Chloride 09/24/2023 98  98 - 107 mmol/L Final    CO2 09/24/2023 26.8  20.0 - 31.0 mmol/L Final    Anion Gap 09/24/2023 12  5 - 14 mmol/L Final    BUN 09/24/2023 6 (L)  9 - 23 mg/dL Final    Creatinine 98/93/7974 0.48 (L)  0.55 - 1.02 mg/dL Final    BUN/Creatinine Ratio 09/24/2023 13   Final    eGFR CKD-EPI (2021) Female 09/24/2023 >90  >=60 mL/min/1.25m2 Final    Glucose 09/24/2023 367 (H)  70 - 179 mg/dL Final    Calcium 98/93/7974 9.1  8.7 - 10.4 mg/dL Final    Albumin 98/93/7974 2.6 (L)  3.4 - 5.0 g/dL Final    Total Protein 09/24/2023 7.7  5.7 - 8.2 g/dL Final    Total Bilirubin 09/24/2023 0.7  0.3 - 1.2 mg/dL Final    AST 98/93/7974 35 (H)  <=34 U/L Final    ALT 09/24/2023 33  10 - 49 U/L Final    Alkaline Phosphatase 09/24/2023 130 (H)  46 - 116 U/L Final   Office Visit on 09/24/2023   Component Date Value Ref Range Status    AFP-Tumor Marker 09/24/2023 3  <=8 ng/mL Final    PT 09/24/2023 14.9 (H)  9.9 - 12.6 sec Final    INR 09/24/2023 1.31   Final    WBC 09/24/2023 2.4 (L)  3.6 - 11.2 10*9/L Final    RBC 09/24/2023 3.81 (L)  3.95 - 5.13 10*12/L Final    HGB 09/24/2023 9.3 (L)  11.3 - 14.9 g/dL Final    HCT 98/93/7974 30.0 (L)  34.0 - 44.0 % Final    MCV 09/24/2023 78.5  77.6 - 95.7 fL Final    MCH 09/24/2023 24.4 (L)  25.9 - 32.4 pg Final    MCHC 09/24/2023 31.1 (L)  32.0 - 36.0 g/dL Final    RDW 98/93/7974 18.7 (H)  12.2 - 15.2 % Final    MPV 09/24/2023 8.9  6.8 - 10.7 fL Final    Platelet 09/24/2023 62 (L)  150 - 450 10*9/L Final   MELD 3.0: 11 at 09/24/2023  1:05 PM  MELD-Na: 9 at 09/24/2023  1:05 PM  Calculated from:  Serum Creatinine: 0.48 mg/dL (Using min of 1 mg/dL) at 04/20/7973  8:94 PM  Serum Sodium: 137 mmol/L at 09/24/2023  1:05 PM  Total Bilirubin: 0.7 mg/dL (Using min of 1 mg/dL) at 04/20/7973  8:94 PM  Serum Albumin: 2.6 g/dL at 04/20/7973  8:94 PM  INR(ratio): 1.31 at 09/24/2023  1:05 PM  Age at listing (hypothetical): 52 years  Sex: Female at 09/24/2023  1:05 PM          Studies:  MRI 05/2023:   Impression   -- No worrisome hepatic lesion.   -- Cirrhosis with portal hypertension. Patent TIPS.   Colonoscopy 02/21/18 ([facility]): good prep, no polyps, small diverticula

## 2023-09-28 NOTE — Unmapped (Signed)
-----   Message from Earlie Counts sent at 09/27/2023 11:13 AM EST -----  Regarding: Physician Orders  [facility] is requesting paperwork fax back in order to deliverer supplies to patient. Wants to know if we received paperwork.       [phone]  Fax- [phone]

## 2023-09-28 NOTE — Unmapped (Signed)
Notified Tangela @ [facility] paper work has not been received. She voiced understanding, and stated she will refax paper work.

## 2023-10-01 DIAGNOSIS — K7469 Other cirrhosis of liver: Principal | ICD-10-CM

## 2023-10-01 DIAGNOSIS — Z95828 Presence of other vascular implants and grafts: Principal | ICD-10-CM

## 2023-10-01 DIAGNOSIS — K7682 Hepatic encephalopathy (CMS-HCC): Principal | ICD-10-CM

## 2023-10-01 MED ORDER — RIFAXIMIN 550 MG TABLET
ORAL_TABLET | Freq: Two times a day (BID) | ORAL | 5 refills | 30.00 days
Start: 2023-10-01 — End: ?

## 2023-10-02 MED ORDER — RIFAXIMIN 550 MG TABLET
ORAL_TABLET | Freq: Two times a day (BID) | ORAL | 5 refills | 30.00 days | Status: CP
Start: 2023-10-02 — End: ?
  Filled 2023-10-04: qty 60, 30d supply, fill #0

## 2023-10-02 NOTE — Unmapped (Signed)
Refill request for Xifaxan    Last clinic visit: 09/24/23 with labs    Next clinic visit: 03/13/24    Will authorize refill at this time

## 2023-10-02 NOTE — Unmapped (Signed)
Notified patient of message. Appointment scheduled. Patient voiced understanding

## 2023-10-02 NOTE — Unmapped (Signed)
-----   Message from Dahlia Bailiff, MD sent at 09/24/2023  1:57 PM EST -----  Ninfa Meeker, can you get her an appointment with me in the next 1-2 weeks to discuss her A1c?    Ronaldo Miyamoto

## 2023-10-03 NOTE — Unmapped (Signed)
Carol Arias requested a refill of their Xifaxan via IVR/Web. [facility] has scheduled delivery per the patients request via UPS to be delivered to their prescription address on 10/05/23.

## 2023-10-19 DIAGNOSIS — E119 Type 2 diabetes mellitus without complications: Principal | ICD-10-CM

## 2023-10-19 MED ORDER — METFORMIN 500 MG TABLET
ORAL_TABLET | Freq: Two times a day (BID) | ORAL | 0 refills | 90.00 days | Status: CP
Start: 2023-10-19 — End: ?

## 2023-10-19 NOTE — Unmapped (Signed)
Patient is requesting the following refill  Requested Prescriptions     Pending Prescriptions Disp Refills    metFORMIN (GLUCOPHAGE) 500 MG tablet 360 tablet 0     Sig: Take 2 tablets (1,000 mg total) by mouth in the morning and 2 tablets (1,000 mg total) in the evening. Take with meals.       Recent Visits  Date Type Provider Dept   08/20/23 Telemedicine Silver, Harvie Junior, FNP [place] [facility]   05/17/23 Office Visit Silver, Harvie Junior, FNP [place] [facility]   01/15/23 Office Visit Silver, Harvie Junior, FNP [place] [facility]   12/26/22 Office Visit Silver, Harvie Junior, FNP [place] [facility]   11/21/22 Office Visit Silver, Harvie Junior, FNP [place] [facility]   Showing recent visits within past 365 days and meeting all other requirements  Future Appointments  Date Type Provider Dept   10/24/23 Appointment Jed Limerick, MD [place] [facility]   11/20/23 Appointment Silver, Harvie Junior, FNP [place] [facility]   Showing future appointments within next 365 days and meeting all other requirements       Labs: A1c:   Hemoglobin A1c (%)   Date Value   09/08/2016 11.1 (H)     Hemoglobin A1C (%)   Date Value   09/24/2023 11.2 (H)   05/17/2023 10.1 (A)

## 2023-10-24 ENCOUNTER — Ambulatory Visit
Admit: 2023-10-24 | Payer: PRIVATE HEALTH INSURANCE | Attending: Student in an Organized Health Care Education/Training Program | Primary: Student in an Organized Health Care Education/Training Program

## 2023-11-01 ENCOUNTER — Ambulatory Visit
Admit: 2023-11-01 | Discharge: 2023-11-02 | Payer: PRIVATE HEALTH INSURANCE | Attending: Retina Specialist | Primary: Retina Specialist

## 2023-11-01 DIAGNOSIS — E119 Type 2 diabetes mellitus without complications: Principal | ICD-10-CM

## 2023-11-01 DIAGNOSIS — H3412 Central retinal artery occlusion, left eye: Principal | ICD-10-CM

## 2023-11-01 DIAGNOSIS — H5462 Unqualified visual loss, left eye, normal vision right eye: Principal | ICD-10-CM

## 2023-11-01 NOTE — Unmapped (Signed)
Carol Arias is seen in consultation at the request of Dr. Harvie Junior Silverfor evaluation of diabetic retinopathy. She reports that she gradually lost vision in left eye starting in early 2024. She saw an optometrist when she first started having symptoms and said she was told it was not cataracts. Pt reports that she does have hypertension that is not well controlled.    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 No NVI OS  - can consider [facility] at [facility]   - discussed obtaining better control of BP, BS     2. No diabetic retinopathy both eyes   Diabetes type II for 7 years. Last A1C was 11.2%.    There is no evidence of retinopathy today on exam.  The importance of tight glucose control and blood pressure control was discussed with the patient and she will work on this with her PCP.    3. Elevated IOP OS  - could be 2/2 CRAO OS vs POAG. No NVI visualized  - will refer to glaucoma for IOP management  - no pain OS    4. Mild cataracts both eyes OS>OD- not visually significant. I recommend cautious observation.              Plan:        RTC Dr. Marcell Anger in [place] next avail for IOP management OS  RTC retina 3 mon, sooner prn (MD to see prior to dilation)        EXTENDED OPHTHALMOSCOPY INTERPRETATION MACULA (90D lens):  Syneresis OD  Blot heme OS    CC: Dr. Harvie Junior Silver

## 2023-11-06 DIAGNOSIS — E119 Type 2 diabetes mellitus without complications: Principal | ICD-10-CM

## 2023-11-06 MED ORDER — METFORMIN 500 MG TABLET
ORAL_TABLET | Freq: Two times a day (BID) | ORAL | 0 refills | 90.00 days | Status: CP
Start: 2023-11-06 — End: ?

## 2023-11-06 NOTE — Unmapped (Signed)
 Carol Arias requested a refill of their Xifaxan via IVR/Web. [facility] has scheduled delivery per the patients request via UPS to be delivered to their prescription address on 11/09/23.

## 2023-11-06 NOTE — Unmapped (Signed)
 Patient is requesting the following refill  Requested Prescriptions     Pending Prescriptions Disp Refills    metFORMIN (GLUCOPHAGE) 500 MG tablet 360 tablet 0     Sig: Take 2 tablets (1,000 mg total) by mouth in the morning and 2 tablets (1,000 mg total) in the evening. Take with meals.       Recent Visits  Date Type Provider Dept   08/20/23 Telemedicine Silver, Harvie Junior, FNP [place] [facility]   05/17/23 Office Visit Silver, Harvie Junior, FNP [place] [facility]   01/15/23 Office Visit Silver, Harvie Junior, FNP [place] [facility]   12/26/22 Office Visit Silver, Harvie Junior, FNP [place] [facility]   11/21/22 Office Visit Silver, Harvie Junior, FNP [place] [facility]   Showing recent visits within past 365 days and meeting all other requirements  Future Appointments  Date Type Provider Dept   11/20/23 Appointment Jed Limerick, MD [place] [facility]   Showing future appointments within next 365 days and meeting all other requirements       Labs: Creatinine:   Creatinine Whole Blood, POC (mg/dL)   Date Value   57/84/6962 0.5 (L)     Creatinine (mg/dL)   Date Value   95/28/4132 0.48 (L)    eGFR:   eGFR CKD-EPI (2021) Female (mL/min/1.34m2)   Date Value   09/24/2023 >90

## 2023-11-09 MED FILL — XIFAXAN 550 MG TABLET: ORAL | 30 days supply | Qty: 60 | Fill #1

## 2023-11-17 ENCOUNTER — Inpatient Hospital Stay: Admit: 2023-11-17 | Discharge: 2023-11-18 | Payer: PRIVATE HEALTH INSURANCE

## 2023-11-17 MED ADMIN — gadopiclenol (ELUCIREM,VUEWAY) injection 8 mL: 8 mL | INTRAVENOUS | @ 17:00:00 | Stop: 2023-11-17

## 2023-11-20 ENCOUNTER — Ambulatory Visit
Admit: 2023-11-20 | Discharge: 2023-11-21 | Payer: PRIVATE HEALTH INSURANCE | Attending: Student in an Organized Health Care Education/Training Program | Primary: Student in an Organized Health Care Education/Training Program

## 2023-11-20 DIAGNOSIS — F419 Anxiety disorder, unspecified: Principal | ICD-10-CM

## 2023-11-20 DIAGNOSIS — M25512 Pain in left shoulder: Principal | ICD-10-CM

## 2023-11-20 DIAGNOSIS — E119 Type 2 diabetes mellitus without complications: Principal | ICD-10-CM

## 2023-11-20 DIAGNOSIS — M542 Cervicalgia: Principal | ICD-10-CM

## 2023-11-20 DIAGNOSIS — Z23 Encounter for immunization: Principal | ICD-10-CM

## 2023-11-20 MED ORDER — TIZANIDINE 2 MG TABLET
ORAL_TABLET | Freq: Three times a day (TID) | ORAL | 0 refills | 7.00 days | Status: CP | PRN
Start: 2023-11-20 — End: 2023-11-27

## 2023-11-20 NOTE — Unmapped (Signed)
 Thank you for the chance to participate in your health care.  Soon you should be receiving a survey about your recent visit to [facility] [place].  Please take a moment to complete this survey.  We take your feedback very seriously and will use it to guide our continuous improvement efforts.     If labs were ordered today and you have an active MyChart, your results will likely be released to you as soon as they are made available to me as well. If there is anything emergent or urgent you will receive a call from the clinic regarding them and next steps. Otherwise, you will receive a message via MyChart or a letter in the mail with your results with a message from me explaining the results.     The best way to get in contact with me and to make appointments is to reach out via MyChart. I do my best to respond to all MyChart messages within 48 business hours. During the week, a message sent overnight will be received the following morning. During the weekend, a message sent will be received on Monday morning. If you need immediate assistance overnight or during the week, please call the clinic at [phone] and ask to speak with the triage nurse and/or on-call physician.

## 2023-11-20 NOTE — Unmapped (Addendum)
 Patient's diabetes is uncontrolled with metformin alone.   -Continue metformin and reaching out to her GI doctor about the potential for starting on a GLP1.

## 2023-11-20 NOTE — Unmapped (Signed)
 [place] [facility] Patient Clinic Note    Assessment & Plan  Need for influenza vaccination    Orders:    INFLUENZA VACCINE IIV3(IM)(PF)6 MOS UP    Acute pain of left shoulder  Patient with history and physical exam as documented below. At this time I suspect her shoulder pain is coming from muscle spasm. I have a lower suspicion for underlying arthritis in the glenohumeral joint or the [facility] joint. Unlikely to be fracture. Unlikely to be rotator cuff etiology.   -Getting xray shoulder.   Orders:    XR Shoulder 3 Or More Views Left; Future    Neck pain  Patient with history and physical exam as documented below. At this time I suspect her neck pain is coming from potential cervical radiculopathy but also on the differential would be muscle spasm.   -Getting xray cervical spine.   -Giving Zanaflex 2 mg PO Q8H PRN.    Orders:    XR Cervical Spine Complete 4 Or 5 Views; Future    tizanidine (ZANAFLEX) 2 MG tablet; Take 1 tablet (2 mg total) by mouth every eight (8) hours as needed for up to 7 days.    New onset type 2 diabetes mellitus (CMS-HCC)  Patient's diabetes is uncontrolled with metformin alone.   -Continue metformin and reaching out to her GI doctor about the potential for starting on a GLP1.              Follow-Up: 4 weeks.     SUBJECTIVE:  Chief Complaint   Patient presents with    Diabetes     Monitoring blood sugars at home. Readings in the 220s-240s.   Denies diabetic sx.     Depression, unspecified depression type         Increased Wellbutrin at last visit with PCP.   Mood has been stable on new dose adjustment.       History of Present Illness  Carol Arias is a 53 year old female with diabetes and cirrhosis who presents with concerns about skin lesions and a referral issue. Her daughter assists with transportation as needed. She was referred by Dr. Herbert Seta for endocrinology consultation.    Skin Lesion:  -She has several skin lesions that initially resemble ringworm, developing a scab-like texture after a few days and eventually leaving a dark red scar.   -No associated itching is present.    Diabetes:  -She is experiencing issues with a referral to an endocrinologist.   -The referral led to a pediatric office in [place], and attempts to resolve this with the referral specialist were unsuccessful.   -She is willing to travel for the appointment, with her daughter assisting with transportation.  -She has diabetes managed with metformin, but her blood sugar levels are running in the 200s, and her last A1c was 11.2.   -She previously tried glipizide, which caused adverse effects.  -She has not tried medications like Jardiance or Ozempic.   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  -She is currently on Xifaxan and lactulose.   -She experiences adverse effects from Lyrica, which she takes only when necessary due to liver intolerance.    Neck and Shoulder Pain:  -She describes pain and numbness starting about three weeks ago, initially feeling like a crick  or stress knot in the shoulder blade area.   -The pain radiates down her back and wraps around, with numbness in the bottom of three fingers.   -The pain is relieved by hot water baths, though heating pads are ineffective.  -She has a history of wrist issues, suspecting carpal tunnel syndrome, but has not pursued treatment due to fear of surgery.   -She recalls a past knee injury treated with tizanidine, which she found helpful.       I have reviewed the patients problem list, medical, family, and social histories, current medications, and allergies and updated them as needed.    ROS:  -As above in HPI and the Assessment and Plan.    Health Maintenance:   Health Maintenance   Topic Date Due    DTaP/Tdap/Td Vaccines (1 - Tdap) Never done    Zoster Vaccines (1 of 2) Never done    Foot Exam 02/09/2023    COVID-19 Vaccine (1 - 2024-25 season) Never done    Lung Cancer Screening Shared Decision Making  09/29/2023    Urine Albumin/Creatinine Ratio  11/21/2023    Hemoglobin A1c  12/23/2023    Serum Creatinine Monitoring  09/23/2024    Potassium Monitoring  09/23/2024    Retinal Eye Exam  10/31/2024    Mammogram  12/20/2024    COPD Spirometry  02/02/2028    Colon Cancer Screening  02/22/2028    Pneumococcal Vaccine 50+  Completed    Influenza Vaccine  Completed       OBJECTIVE:  Vitals:   Vitals:    11/20/23 1113   BP: 124/62   Pulse: 93   Resp: 18   Temp: 36.8 ??C (98.3 ??F)   SpO2: 97%             11/20/23 76.2 kg (168 lb)       Physical Exam:  Constitutional: Well-developed, well-nourished, and in no distress.  HENT:   Head: Normocephalic and atraumatic.   Right Ear: External ear normal.   Left Ear: External ear normal.   Nose: Normal on external review.   Mouth: Normal on external review.  Eyes: Right eye exhibits no discharge. Left eye exhibits no discharge. No scleral icterus.   Neck: Neck supple.   Cardiovascular: Normal rate.   Pulmonary/Chest: Effort normal. No stridor. No respiratory distress.   Abdominal: No abdominal distension.   Musculoskeletal: General: No deformity. Tenderness to palpation over to the left of midline of the midthoracic spine just medial to the scapular border. Tenderness to palpation over the trapezius over the shoulder region. Spurling's positive on the left.   Neurological: Alert and oriented. 5/5 strength and sensation in the bilateral upper extremities aside from 4/5 resisted finger abduction.   Skin: Skin is dry.   Psychiatric: Mood and affect normal.   Vitals reviewed.    Zachery Dauer, MD    [facility] [place]  [phone]  Telephone [phone]  Fax [phone]

## 2023-11-21 DIAGNOSIS — K7469 Other cirrhosis of liver: Principal | ICD-10-CM

## 2023-11-23 MED ORDER — SEMAGLUTIDE 0.25 MG OR 0.5 MG (2 MG/3 ML) SUBCUTANEOUS PEN INJECTOR
SUBCUTANEOUS | 6 refills | 56.00 days | Status: CP
Start: 2023-11-23 — End: ?

## 2023-11-28 NOTE — Unmapped (Signed)
 Carol Arias requested a refill of their Xifaxan via IVR/Web. [facility] has scheduled delivery per the patients request via UPS to be delivered to their prescription address on 12/04/23.

## 2023-12-03 MED FILL — XIFAXAN 550 MG TABLET: ORAL | 30 days supply | Qty: 60 | Fill #2

## 2023-12-05 ENCOUNTER — Emergency Department
Admit: 2023-12-05 | Discharge: 2023-12-05 | Disposition: A | Discharge status: Home / Self Care | Payer: PRIVATE HEALTH INSURANCE | Attending: Student in an Organized Health Care Education/Training Program

## 2023-12-05 DIAGNOSIS — M25421 Effusion, right elbow: Principal | ICD-10-CM

## 2023-12-05 NOTE — Unmapped (Signed)
 [facility] Emergency Department Provider Note    ED CLINICAL IMPRESSION:     Final diagnoses:   Elbow effusion, right (Primary)     MEDICAL DECISION MAKING:     Diagnostic orders as below.  Orders Placed This Encounter   Procedures    XR Elbow Right AP And Lateral    Ambulatory referral to Orthopedic Surgery    Assess    Elevate Extremity    Notify Provider       Final Assessment/Plan    Elbow effusion, right  - Ambulatory referral to Orthopedic Surgery; Future    Patient presents with right arm pain. Vital signs are reassuring. Exam notable for right elbow swelling and tenderness.  Patient neurovascularly intact with sensation intact.  Strong pulse with normal cap refill    XR Elbow Right AP And Lateral   Final Result      --Large effusion suspicious for radiographically occult fracture. Supracondylar nondisplaced humeral fracture is suspected given lateral and posterior cortical regularity.        Discussed with radiology about the findings.  Story is not convincing for fracture.  Suspect likely ligamentous injury.  Either way, placed the patient in a posterior splint.  Patient had symptomatic control in the splint.  Patient would like to be referred to at orthopedics office of aspirin.  Did do an x-ray external referral to [facility] orthopedics and provided a phone number for her.  Explained to her she would need to call.  I also explained I would like for her to be seen in the next week and if she cannot get in with them, to go to the [facility] clinic.  Patient is neurovascularly intact.    - Provided sling   - Referred for outpatient follow up with orthopedics   - Instructed to return to the emergency department if symptoms worsen  - Educated on return precautions   - The patient was sent home with the following medication(s):  New Prescriptions    No medications on file        MEDICAL HISTORY:      CHIEF COMPLAINT:   Arm Pain      HISTORY OF PRESENT ILLNESS:   Carol Arias is a 53 y.o. female who presents to the [facility] ED today for right arm pain.     HPI: Pt reports she was getting out of the bathtub on Monday night (2 days ago) and injured her right arm. She states she had her wrist planted and was bearing weight on her right arm, when it slipped and twisted. She denies trauma. Since then, she reports pain and swelling of her right elbow that has progressively worsened. She endorses warmth.       SURGICAL HISTORY:  has a past surgical history that includes Appendectomy (53 yo); Total abdominal hysterectomy (01/1999); Eye surgery; Hysterectomy; pr upper gi endoscopy,diagnosis (N/A, 07/17/2017); pr upper gi endoscopy,ligat varix (N/A, 02/25/2019); pr upper gi endoscopy,diagnosis (N/A, 03/25/2019); pr upper gi endoscopy,ligat varix (N/A, 01/10/2022); pr upper gi endoscopy,diagnosis (N/A, 02/10/2022); pr upper gi endoscopy,diagnosis (N/A, 04/13/2022); pr upper gi endoscopy,ligat varix (N/A, 08/01/2022); pr upper gi endoscopy,diagnosis (N/A, 09/08/2022); and IR Tips (10/13/2022).    OUTPATIENT MEDICATIONS:  Prior to Admission medications    Medication Sig Start Date End Date Taking? Authorizing Provider   atorvastatin (LIPITOR) 20 MG tablet Take 1 tablet (20 mg total) by mouth daily.  Patient not taking: Reported on 11/20/2023 05/17/23 05/16/24  Silver, Harvie Junior, FNP   blood sugar diagnostic (  GLUCOSE BLOOD) Strp Check blood sugar before breakfast and as needed. 03/06/22   Silver, Harvie Junior, FNP   blood-glucose meter kit Use as instructed 05/18/21   Silver, Harvie Junior, FNP   buPROPion (WELLBUTRIN XL) 300 MG 24 hr tablet Take 1 tablet (300 mg total) by mouth daily. 05/17/23 05/16/24  Silver, Harvie Junior, FNP   furosemide (LASIX) 20 MG tablet Take 1 tablet (20 mg total) by mouth daily. Take an additional 20 mg tablet on Monday, Wednesday, Friday for a total of 40 mg on those days. 01/22/23 01/22/24  Silver, Harvie Junior, FNP   lactulose 10 gram/15 mL solution Take 30 mL (20 g total) by mouth Three (3) times a day. 09/24/23 Yoxheimer, Reeves Forth, AGNP   lisinopril (PRINIVIL,ZESTRIL) 20 MG tablet Take 1 tablet (20 mg total) by mouth daily. 08/20/23   Silver, Harvie Junior, FNP   magnesium oxide 500 mg magnesium Tab Take 500 mg by mouth daily. 01/31/23   Silver, Harvie Junior, FNP   metFORMIN (GLUCOPHAGE) 500 MG tablet Take 2 tablets (1,000 mg total) by mouth in the morning and 2 tablets (1,000 mg total) in the evening. Take with meals. 11/06/23   Silver, Harvie Junior, FNP   multivitamin (TAB-A-VITE/THERAGRAN) per tablet Take 1 tablet by mouth daily.    [provider]   ondansetron (ZOFRAN-ODT) 4 MG disintegrating tablet Take 1 tablet (4 mg total) by mouth every eight (8) hours as needed for nausea. 08/20/23   Silver, Harvie Junior, FNP   pantoprazole (PROTONIX) 40 MG tablet Take 1 tablet (40 mg total) by mouth daily before breakfast. 08/20/23   Silver, Harvie Junior, FNP   potassium chloride 10 MEQ ER tablet Take 1 tablet (10 mEq total) by mouth daily. 08/20/23 08/19/24  Silver, Harvie Junior, FNP   pregabalin (LYRICA) 150 MG capsule Take 1 capsule (150 mg total) by mouth Three (3) times a day. 08/23/23   Libby Maw, FNP   rifAXIMin [place]) 550 mg Tab Take 1 tablet (550 mg total) by mouth two (2) times a day. 10/02/23   Yoxheimer, Reeves Forth, AGNP   semaglutide 0.25 mg or 0.5 mg (2 mg/3 mL) PnIj Inject 0.25 mg under the skin every seven (7) days. 11/23/23   Jed Limerick, MD   tizanidine (ZANAFLEX) 2 MG tablet Take 1 tablet (2 mg total) by mouth every eight (8) hours as needed for up to 7 days. 11/20/23 11/27/23  Jed Limerick, MD     ALLERGIES: is allergic to amitriptyline and acetaminophen.    FAMILY HISTORY: family history includes Cancer in her paternal grandmother; Diabetes in her father; Drug abuse in her mother; Heart attack in her father; Uterine cancer in her maternal aunt and maternal grandmother.    SOCIAL HISTORY:  Tobacco use:  reports that she has been smoking cigarettes and e-cigarettes. She has a 30 pack-year smoking history. She has been exposed to tobacco smoke. She has never used smokeless tobacco.   Alcohol use:  reports that she does not currently use alcohol.  Drug use:  reports current drug use. Drug: Marijuana.    PHYSICAL EXAM     ED Vital Signs:  Vitals:    12/05/23 1650   BP: 122/78   Pulse: 99   Resp: 20   Temp: 37.1 ??C (98.7 ??F)   TempSrc: Oral   SpO2: 97%     Physical Exam  Vitals reviewed.   Constitutional:       General: She is not in acute distress.  Appearance: Normal appearance. She is not ill-appearing, toxic-appearing or diaphoretic.   HENT:      Head: Normocephalic and atraumatic.      Right Ear: External ear normal.      Left Ear: External ear normal.      Nose: Nose normal.   Pulmonary:      Effort: Pulmonary effort is normal.   Musculoskeletal:      Right elbow: Swelling present. Tenderness present.      Comments: Good radial pulse on right. Sensation of RUE intact.    Skin:     Coloration: Skin is not pale.   Neurological:      Mental Status: She is alert.   Psychiatric:         Judgment: Judgment normal.       LABORATORY DATA     Labs Reviewed - No data to display  RADIOLOGY     XR Elbow Right AP And Lateral  Result Date: 12/05/2023  EXAM: XR ELBOW 2 VIEWS RIGHT DATE: 12/05/2023 5:06 PM ACCESSION: [id number] [facility] DICTATED: 12/05/2023 5:37 PM INTERPRETATION LOCATION: [facility] CLINICAL INDICATION: 53 years old Female with Trauma/Injury    COMPARISON: None. TECHNIQUE: AP and lateral views of the right elbow. FINDINGS: No acute fracture seen. No malalignment. Joint spaces are preserved. Large anterior elbow effusion. Diffuse soft tissue swelling of the elbow. Osseous irregularity along the posterior epicondylar region. No radiopaque foreign bodies.     --Large effusion suspicious for radiographically occult fracture. Supracondylar nondisplaced humeral fracture is suspected given lateral and posterior cortical regularity.    XR Cervical Spine Complete 4 Or 5 Views  Result Date: 12/05/2023  EXAM: XR CERVICAL SPINE COMPLETE 4 OR 5 VIEWS DATE: 12/05/2023 4:07 PM ACCESSION: [id number] CH DICTATED: 12/05/2023 4:08 PM INTERPRETATION LOCATION: [facility] CLINICAL INDICATION: 53 years old Female with Concern for cervical radiculopathy  - M54.2 - Neck pain  COMPARISON: None. TECHNIQUE: AP, lateral, odontoid, and bilateral oblique views of the cervical spine. FINDINGS: Straightening of cervical lordosis. No listhesis. Normal atlantoaxial joint alignment. Multilevel intervertebral disc space narrowing and endplate osteophytes, greatest at C5-C6 and C6-C7. Mild uncovertebral osseous overgrowth at C5-C6 and C6-C7. Left osseous neural foraminal narrowing at C5-C6 and C6-7. Mild right osseous neural foraminal narrowing at C5-C6. Prevertebral soft tissues are unremarkable.     Multilevel degenerative disc disease, greatest and mild at C5-6. Bilateral osseous neural foraminal narrowing.    XR Shoulder 3 Or More Views Left  Result Date: 12/05/2023  EXAM: XR SHOULDER 3 OR MORE VIEWS LEFT DATE: 12/05/2023 4:07 PM ACCESSION: [id number] CH DICTATED: 12/05/2023 4:08 PM INTERPRETATION LOCATION: [facility] CLINICAL INDICATION: 53 years old Female with Left shoulder pain  - M25.512 - Acute pain of left shoulder    COMPARISON: None. TECHNIQUE: AP, Grashey, and axillary views of the left shoulder. FINDINGS: No acute fracture. Joint alignment is normal. Acromioclavicular and glenohumeral narrowing with small osteophytes. Sclerosis at the greater tuberosity. No soft tissue swelling. Visualized left hemithorax is clear.     Mild acromioclavicular and glenohumeral osteoarthrosis. Osseous changes at the greater tuberosity which can be seen in rotator cuff tendinopathy.      PROCEDURES   Orthopedic Injury    Date/Time: 12/05/2023 6:50 PM    Performed by: Orpah Melter, DO  Authorized by: Orpah Melter, DO      Injury:     Injury location:  Elbow    Location details:  Right elbow    Injury type:  Fracture    Pre-procedure Assessment:  Neurovascular status: Neurovascularly intact      Procedure Details:     Immobilization:  Splint    Splint type: Posterior splint.    Supplies used:  Ortho-Glass    Post-procedure Assessment:     Neurovascular status: Neurovascularly intact      Patient tolerance:  Patient tolerated the procedure well with no immediate complications          Pertinent labs & imaging results that were available during my care of the patient were reviewed by me and considered in my medical decision making. Labs and radiology studies included in my note may not constitute all ordered/reviewied labs during this encounter (see chart for details).    __________________________________________    Portions of this record have been created using Scientist, clinical (histocompatibility and immunogenetics). Dictation errors have been sought, but may not have been identified and corrected.    The chart was modified by Poulomi Rudrappa using scribe services on December 05, 2023 6:01 PM, on behalf of Bernardo Heater, [place].     Documentation assistance was provided by the scribe in my presence. The documentation recorded by the scribe has been reviewed by me, amended with [employer], and accurately reflects the services I personally performed.       Orpah Melter, DO  12/05/23 1851

## 2023-12-05 NOTE — Unmapped (Signed)
 Pt reports slipping in bath two days ago and injuring right elbow. Elbow is moderately swollen and tender. Neurovascular intact

## 2023-12-18 ENCOUNTER — Ambulatory Visit
Admit: 2023-12-18 | Attending: Student in an Organized Health Care Education/Training Program | Primary: Student in an Organized Health Care Education/Training Program

## 2023-12-31 NOTE — Unmapped (Signed)
 Patient scheduled appointment online.

## 2024-01-04 ENCOUNTER — Ambulatory Visit: Admit: 2024-01-04 | Discharge: 2024-01-05 | Payer: Medicaid (Managed Care)

## 2024-01-04 DIAGNOSIS — F17210 Nicotine dependence, cigarettes, uncomplicated: Principal | ICD-10-CM

## 2024-01-04 DIAGNOSIS — E119 Type 2 diabetes mellitus without complications: Principal | ICD-10-CM

## 2024-01-04 DIAGNOSIS — M79641 Pain in right hand: Principal | ICD-10-CM

## 2024-01-04 MED ORDER — SEMAGLUTIDE 0.25 MG OR 0.5 MG (2 MG/3 ML) SUBCUTANEOUS PEN INJECTOR
SUBCUTANEOUS | 3 refills | 28.00 days | Status: CP
Start: 2024-01-04 — End: ?

## 2024-01-04 MED ORDER — PREDNISONE 20 MG TABLET
ORAL_TABLET | 0 refills | 0.00 days | Status: CP
Start: 2024-01-04 — End: ?

## 2024-01-04 MED FILL — XIFAXAN 550 MG TABLET: ORAL | 28 days supply | Qty: 56 | Fill #3

## 2024-01-04 NOTE — Unmapped (Signed)
 [facility] Patient Clinic Note    Assessment & Plan  Type 2 diabetes mellitus without complication, without long-term current use of insulin  Her A1c was 8.2 which is down from 10.1 at previous visit.  She will continue semaglutide  however, increase dose to 0.5 mg every 7 days.  Additionally, she will continue metformin .  Advised to adhere to a heart healthy diabetic diet and exercise a minimum of 150 minutes weekly.  Questions encouraged and answered.  Understanding verbalized.      Orders:    POCT glycosylated hemoglobin (Hb A1C)    Albumin/creatinine urine ratio; Future    semaglutide  0.25 mg or 0.5 mg (2 mg/3 mL) PnIj; Inject 0.5 mg under the skin every seven (7) days.    Cigarette smoker  She continues to endorse smoking.  She reports some days she only smokes about 5 cigarettes daily due to aversion since starting semaglutide .  She is due for her annual low-dose CT screening.  Orders:    CT Lung Cancer Screening Baseline or Annual Low Dose; Future    Right hand pain  Today, she has complaints of right hand pain, numbness, tingling, cold sensation.  She has been evaluated by orthopedics in [place] for an elbow injury.  She reports that once she was released from restriction, she positioned herself with her right hand on the bed when she was putting sheets on and immediately felt what she describes as a rubber band pop in her palm.  Advised to follow back up with orthopedics.  Additionally, will trial a low-dose steroid taper for symptom management.  Grip strength less on right than left.  Orders:    predniSONE  (DELTASONE ) 20 MG tablet; 40 mg by mouth daily x 3 days, then 20 mg by mouth daily x 2 days, then 10 mg by mouth daily x 2 days.      Follow-Up: 3 months      SUBJECTIVE:  Chief Complaint   Patient presents with    Diabetes    Hand Pain     Patient reports R hand pain x 2 weeks.     Mood     PHQ9=        HPI:  Shamiracle Gorden is a 53 y.o. female who presents to clinic today for an established patient visit.     I have reviewed the patients problem list, medical, family, and social histories, current medications, and allergies and updated them as needed.    ROS:  -As above in HPI and the Assessment and Plan.    Health Maintenance:   Health Maintenance   Topic Date Due    DTaP/Tdap/Td Vaccines (1 - Tdap) Never done    Zoster Vaccines (1 of 2) Never done    Foot Exam  02/09/2023    COVID-19 Vaccine (1 - 2024-25 season) Never done    Lung Cancer Screening Shared Decision Making  09/29/2023    Urine Albumin/Creatinine Ratio  11/21/2023    Hemoglobin A1c  12/23/2023    Serum Creatinine Monitoring  09/23/2024    Potassium Monitoring  09/23/2024    Retinal Eye Exam  10/31/2024    Mammogram  12/20/2024    COPD Spirometry  02/02/2028    Colon Cancer Screening  02/22/2028    Pneumococcal Vaccine 50+  Completed    Influenza Vaccine  Completed       OBJECTIVE:  Vitals:   Vitals:    01/04/24 0859   Pulse: 119  Resp: 12   Temp: 36.9 ??C (98.4 ??F)   SpO2: 97%             01/04/24 72.8 kg (160 lb 9.6 oz)       Physical Exam:  Constitutional: Well-developed, well-nourished, and in no distress.  HENT:   Head: Normocephalic and atraumatic.   Right Ear: External ear normal.   Left Ear: External ear normal.   Nose: Normal on external review.   Mouth: Normal on external review.  Eyes: Right eye exhibits no discharge. Left eye exhibits no discharge. No scleral icterus.   Neck: Neck supple.   Cardiovascular: Normal rate.   Pulmonary/Chest: Effort normal. No stridor. No respiratory distress.   Abdominal: No abdominal distension.   Musculoskeletal: General: No deformity.   Neurological: Alert and oriented.   Skin: Skin is dry.   Psychiatric: Mood and affect normal.   Vitals reviewed.      [facility] [place]  [phone]  Telephone [phone]  Fax [phone]

## 2024-01-04 NOTE — Unmapped (Deleted)
 Orders:    semaglutide  0.25 mg or 0.5 mg (2 mg/3 mL) PnIj; Inject 0.5 mg under the skin every seven (7) days.

## 2024-01-04 NOTE — Unmapped (Addendum)
 Increase Semaglutide  injection to 0.5 mg    You will receive a call to schedule your low dose lung cancer screening    Low dose steroid taper for right hand. Please also schedule with the orthopedic specialist.

## 2024-01-19 ENCOUNTER — Inpatient Hospital Stay: Admit: 2024-01-19 | Discharge: 2024-01-20 | Payer: Medicaid (Managed Care)

## 2024-01-24 ENCOUNTER — Ambulatory Visit: Admit: 2024-01-24 | Payer: Medicaid (Managed Care) | Attending: Retina Specialist | Primary: Retina Specialist

## 2024-01-31 DIAGNOSIS — M79641 Pain in right hand: Principal | ICD-10-CM

## 2024-02-01 MED FILL — XIFAXAN 550 MG TABLET: ORAL | 28 days supply | Qty: 56 | Fill #4

## 2024-02-18 ENCOUNTER — Ambulatory Visit: Admit: 2024-02-18 | Discharge: 2024-02-19 | Payer: Medicaid (Managed Care)

## 2024-02-18 DIAGNOSIS — S66811A Strain of other specified muscles, fascia and tendons at wrist and hand level, right hand, initial encounter: Principal | ICD-10-CM

## 2024-02-18 NOTE — Unmapped (Signed)
 SPORTS MEDICINE CONSULTATION VISIT    ASSESSMENT AND PLAN      Diagnosis ICD-10-CM Associated Orders   1. Rupture of flexor tendon of right hand, initial encounter  [phone] Orthopedic Surgery           Discussed with her that her exam is consistent with rupture of the right index finger FDP. Unfortunately, it was been over 2 months since her injury. Recommend follow up with one of the hand surgeons to discuss surgical options. Advised her to get a copy of her right hand xrays from [place] [facility] to bring with her to her appointment with one of the hand surgeons, otherwise, she may need dedicated finger xrays.     Return for F/U this week w/ Dr. Patterson/Draeger/Knoll, R index FDP rupture.    Procedure(s):  none      SUBJECTIVE     Chief Complaint:   Chief Complaint   Patient presents with    Hand Pain     right        History of Present Illness: 53 y.o. right-handed female with history of hepatitis C, cirrhosis, COPD, HTN, diabetes, history of GIB, who presents for right hand pain. DOI: 12/03/2023, pushing herself up into her bed weightbearing in the proximal palm, twisted her hand and felt like a rubber band snapped in the palm of her hand. Evaluated at [facility] ED on 12/05/2023, at that time, symptoms seemed localized to the elbow, xrays showed elbow effusion, arm sling given, referred to Orthopaedics, followed up with [facility], diagnosed with CTS, hand xrays showed thumb CMC DJD, steroid injection given in thumb CMC joint, severely painful, has not helped pain the palm of the hand. Evaluated by PCP on 01/04/2024, oral prednisone prescribed. She also mentions the day prior to this incident, she was having a stress knot in the left trapezius with pain that radiated to the left triceps and posterior left elbow. Localizes pain in the ulnar and distal aspect of the the thenar eminence.  Elicits numbness in the thumb, index and long fingers. No elbow pain.   Last A1c 8.2% on 01/04/2024    Past Medical History:   Past Medical History:   Diagnosis Date    Chronic back pain 1999    After falling out of 2 story building    COPD (chronic obstructive pulmonary disease)   2010    Hepatitis C     Cured with medication    Hip dislocation, right, initial encounter (CMS-HCC) 1999    After falling out of 2 story building    History of blood transfusion 10/06/2018    Bleeding ulcer, 1 unit pRBCs    Hypertension 2004    Hypokalemia     Mixed hyperlipidemia 09/14/2016    New onset type 2 diabetes mellitus   09/14/2016    Other cirrhosis of liver   06/19/2017    Pneumonia 10/21/2016    RUL w/ sepsis    Upper GI bleed     Mallory Weiss tear noted EGD 10/2017    Vitamin D deficiency        Work/school/SH: does not work; smokes 3-4 cigarettes/day    OBJECTIVE     Physical Exam:  Vitals:   Wt Readings from Last 3 Encounters:   01/04/24 72.8 kg (160 lb 9.6 oz)   11/20/23 76.2 kg (168 lb)   09/24/23 80.3 kg (177 lb)     Estimated body mass index is 26.73 kg/m?? as calculated from the following:  Height as of this encounter: 165.1 cm (5' 5).    Weight as of 01/04/24: 72.8 kg (160 lb 9.6 oz).  Gen: Well-appearing female in no acute distress  MSK: right hand: unable to flexion DIP joint of index finger when isolated. Able to flex and extend PIP and 2nd MCP joints when isolated. Able to passively flex DIP, PIP and 2nd MCP, unable to hold DIP joint in flexed after passively flexed. Rests with the 2nd MCP, DIP and PIP joints of right index finger in extension. + EPL, IO. Pain over the volar 2nd MCP joint reproduced with passive extension of MCP joint. Wrist ROM full, palmar pain reproduced with wrist extension.  Elbow ROM full and painless.     Imaging/other tests: Had xrays of the right hand at [facility], did not bring images with her today. Advised to get a copy of her xrays on CD.     @SPORTSPROMIS @      ADMINISTRATIVE     I have personally reviewed and interpreted the images (as available).  Point-of-care ultrasound imaging is on file and stored in a permanent location (if performed).  I have personally reviewed prior records and incorporated relevant information above (as available).    MEDICAL DECISION MAKING (level of service defined by 2/3 elements)     Number/Complexity of Problems Addressed 1 acute complicated injury (99204/99214)   Amount/Complexity of Data to be Reviewed/Analyzed 2 points: Review prior notes (1 point per unique source); Review test results (1 point per unique test); Order tests (1 point per unique test) (99203/99213)   Risk of Complications/Morbidity/Mortality of Management Over-the-counter Medications (99203/99213)     TIME     Total Time for E/M Services on the Date of Encounter [phone] - I personally spent 30-39 minutes face-to-face and non-face-to-face in the care of this patient, excluding time spent during separately reported procedures, but including all pre, intra, and post visit time on the date of service.     CONSULTATION     Consultation services provided YES - Consultation was performed at the request of  Heather Silver. to whom my opinion and all services ordered and performed were communicated via written report.     MODIFIER 25 (Significant, Separately Billable Evaluation and Management)     Documentation to ensure appropriate insurance payment for medically necessary work    Per the [employer] (rev. 09/18/2021) Chapter 13, Section B. Evaluation & Management (E&M) Services, paragraph 5:   ???In general, E&M services on the same date of service as the minor surgical procedure are included in the payment for the procedure???However, a significant and separately identifiable E&M service unrelated to the decision to perform the minor surgical procedure is separately reportable with modifier 25. The E&M service and minor surgical procedure do not require different diagnoses.???    Per the American Medical Association in Reporting CPT Modifier 25 [CPT??Geophysicist/field seismologist (Online). 2023;33(11):1-12.] Page 1, Appropriate Use, paragraph 1:   ???Modifier 25 is used to indicate that a patient's condition required a significant, separately identifiable evaluation and management (E/M) service above and beyond that associated with another procedure or service being reported by the same physician or other qualified health care professional [facility]) on the same date. This service must be above and beyond the other service provided or beyond the usual preoperative and postoperative care associated with the procedure or service that was performed on that same date, and it must be substantiated by documentation in the patient's record  that satisfies the relevant criteria for the respective E/M service to be reported.???    Per the American Medical Association in Reporting CPT Modifier 25 [CPT??Geophysicist/field seismologist (Online). 2023;33(11):1-12.] Page 2, Considerations, bullet point 2 (Requires awareness of usual preoperative and postoperative services):   ???Pre- and post-operative services typically associated with a procedure include the following and cannot be reported with a separate E/M services code:   Review of patient's relevant past medical history,  Assessment of the problem area to be treated by surgical or other service,  Formulation and explanation of the clinical diagnosis,  Review and explanation of the procedure to the patient, family, or caregiver,  Discussion of alternative treatments or diagnostic options,  Obtaining informed consent,  Providing postoperative care instructions,  Discussion of any further treatment and follow up after the procedure???    As the service provider for this encounter, I attest that the patient's condition required a significant, separately identifiable, medical necessary evaluation and management service in addition to the procedure performed on the same date of service. The evaluation and management service was above and beyond the usual preoperative and postoperative care associated with the procedure. The specific elements of the encounter that represent evaluation and management service above and beyond the usual preoperative and postoperative care associated with the procedure include, but are not limited to:          PROCEDURES     Procedures     DME     DME ORDER:  Dx:  ,

## 2024-02-25 MED FILL — XIFAXAN 550 MG TABLET: ORAL | 28 days supply | Qty: 56 | Fill #5

## 2024-02-25 NOTE — Unmapped (Signed)
 Spoke to pt this afternoon re: thumb, transportation has been unreliable, making it hard for her to keep appointments at [facility], canceled her appointment with Dr. Lupe Salk this week.   Followed up with Orthopaedics in [place] today, repeated xrays and referring her to someone in [place].   Will wait on referral to OT.

## 2024-03-05 MED ORDER — PREGABALIN 150 MG CAPSULE
ORAL_CAPSULE | Freq: Three times a day (TID) | ORAL | 5 refills | 30.00000 days
Start: 2024-03-05 — End: ?

## 2024-03-05 MED ORDER — PANTOPRAZOLE 40 MG TABLET,DELAYED RELEASE
ORAL_TABLET | Freq: Every day | ORAL | 1 refills | 90.00000 days | Status: CP
Start: 2024-03-05 — End: ?

## 2024-03-05 NOTE — Unmapped (Signed)
 Patient is requesting the following refill  Requested Prescriptions     Pending Prescriptions Disp Refills    pantoprazole (PROTONIX) 40 MG tablet 180 tablet 0     Sig: Take 1 tablet (40 mg total) by mouth daily before breakfast.       Recent Visits  Date Type Provider Dept   01/04/24 Office Visit Silver, Powell Chol, FNP [place] [facility]   11/20/23 Office Visit Seena Thom Duncans, MD [place] [facility]   08/20/23 Telemedicine Silver, Powell Chol, FNP [place] [facility]   05/17/23 Office Visit Silver, Powell Chol, FNP [place] [facility]   Showing recent visits within past 365 days and meeting all other requirements  Future Appointments  Date Type Provider Dept   04/04/24 Appointment Silver, Powell Chol, FNP [place] [facility]   Showing future appointments within next 365 days and meeting all other requirements       Labs: Not applicable this refill

## 2024-03-05 NOTE — Unmapped (Signed)
 Patient is requesting the following refill  Requested Prescriptions     Pending Prescriptions Disp Refills    pregabalin (LYRICA) 150 MG capsule 90 capsule 5     Sig: Take 1 capsule (150 mg total) by mouth Three (3) times a day.       Recent Visits  Date Type Provider Dept   01/04/24 Office Visit Silver, Powell Chol, FNP [place] [facility]   11/20/23 Office Visit Seena Thom Duncans, MD [place] [facility]   08/20/23 Telemedicine Silver, Powell Chol, FNP [place] [facility]   05/17/23 Office Visit Silver, Powell Chol, FNP [place] [facility]   Showing recent visits within past 365 days and meeting all other requirements  Future Appointments  Date Type Provider Dept   04/04/24 Appointment Silver, Powell Chol, FNP [place] [facility]   Showing future appointments within next 365 days and meeting all other requirements       Labs: Not applicable this refill

## 2024-03-06 MED ORDER — PREGABALIN 150 MG CAPSULE
ORAL_CAPSULE | Freq: Three times a day (TID) | ORAL | 5 refills | 30.00000 days | Status: CP
Start: 2024-03-06 — End: ?

## 2024-03-09 MED ORDER — PANTOPRAZOLE 40 MG TABLET,DELAYED RELEASE
ORAL_TABLET | Freq: Every day | ORAL | 1 refills | 90.00000 days
Start: 2024-03-09 — End: ?

## 2024-03-11 MED ORDER — PANTOPRAZOLE 40 MG TABLET,DELAYED RELEASE
ORAL_TABLET | Freq: Every day | ORAL | 1 refills | 90.00000 days | Status: CP
Start: 2024-03-11 — End: ?

## 2024-03-11 NOTE — Unmapped (Signed)
 Patient is requesting the following refill  Requested Prescriptions     Pending Prescriptions Disp Refills    pantoprazole  (PROTONIX ) 40 MG tablet 90 tablet 1     Sig: Take 1 tablet (40 mg total) by mouth daily before breakfast.       Recent Visits  Date Type Provider Dept   01/04/24 Office Visit Silver, Powell Chol, FNP [place] [facility]   11/20/23 Office Visit Seena Thom Duncans, MD [place] [facility]   08/20/23 Telemedicine Silver, Powell Chol, FNP [place] [facility]   05/17/23 Office Visit Silver, Powell Chol, FNP [place] [facility]   Showing recent visits within past 365 days and meeting all other requirements  Future Appointments  Date Type Provider Dept   04/04/24 Appointment Silver, Powell Chol, FNP [place] [facility]   Showing future appointments within next 365 days and meeting all other requirements       Labs: Pregnancy: No results found for: PREGPOC, PREGSERUM

## 2024-04-04 ENCOUNTER — Encounter: Admit: 2024-04-04 | Discharge: 2024-04-04 | Payer: Medicaid (Managed Care)

## 2024-04-04 DIAGNOSIS — R609 Edema, unspecified: Principal | ICD-10-CM

## 2024-04-04 DIAGNOSIS — E119 Type 2 diabetes mellitus without complications: Principal | ICD-10-CM

## 2024-04-04 DIAGNOSIS — E1142 Type 2 diabetes mellitus with diabetic polyneuropathy: Principal | ICD-10-CM

## 2024-04-04 DIAGNOSIS — Z95828 Presence of other vascular implants and grafts: Principal | ICD-10-CM

## 2024-04-04 DIAGNOSIS — I10 Essential (primary) hypertension: Principal | ICD-10-CM

## 2024-04-04 DIAGNOSIS — K7682 Hepatic encephalopathy: Principal | ICD-10-CM

## 2024-04-04 DIAGNOSIS — F32A Depression, unspecified depression type: Principal | ICD-10-CM

## 2024-04-04 DIAGNOSIS — K7469 Other cirrhosis of liver: Principal | ICD-10-CM

## 2024-04-04 LAB — ALBUMIN / CREATININE URINE RATIO
ALBUMIN QUANT URINE: 0.9 mg/dL
ALBUMIN/CREATININE RATIO: 13.8 ug/mg (ref 0.0–30.0)
CREATININE, URINE: 65 mg/dL

## 2024-04-04 MED ORDER — BUPROPION HCL XL 300 MG 24 HR TABLET, EXTENDED RELEASE
ORAL_TABLET | Freq: Every day | ORAL | 3 refills | 90.00000 days | Status: CP
Start: 2024-04-04 — End: 2025-04-04

## 2024-04-04 MED ORDER — LISINOPRIL 20 MG TABLET
ORAL_TABLET | Freq: Every day | ORAL | 1 refills | 180.00000 days | Status: CP
Start: 2024-04-04 — End: ?

## 2024-04-04 MED ORDER — XIFAXAN 550 MG TABLET
ORAL_TABLET | Freq: Two times a day (BID) | ORAL | 5 refills | 30.00000 days
Start: 2024-04-04 — End: ?

## 2024-04-04 MED ORDER — SEMAGLUTIDE 0.25 MG OR 0.5 MG (2 MG/3 ML) SUBCUTANEOUS PEN INJECTOR
SUBCUTANEOUS | 3 refills | 28.00000 days | Status: CP
Start: 2024-04-04 — End: ?

## 2024-04-04 NOTE — Unmapped (Signed)
 Stop Metformin  for now. We will re-check your A1C in 3 months.    Continue Ozempic  at current dose.

## 2024-04-04 NOTE — Unmapped (Addendum)
 Her blood pressure is at goal.  She denies chest pain or pressure, lightheadedness or dizziness, shortness of breath, headache.  She will continue lisinopril  daily.  See above for counseling.  Orders:    lisinopril  (PRINIVIL ,ZESTRIL ) 20 MG tablet; Take 1 tablet (20 mg total) by mouth daily.

## 2024-04-04 NOTE — Unmapped (Deleted)
 Orders:    POCT glycosylated hemoglobin (Hb A1C)    Microalbumin / creatinine urine ratio; Future

## 2024-04-04 NOTE — Unmapped (Addendum)
 Is stable at this time.

## 2024-04-04 NOTE — Unmapped (Signed)
 [facility] Note    Assessment & Plan  Type 2 diabetes mellitus without complication, without long-term current use of insulin    Her A1c is 5.6 which is improved from 8.2 three months ago and 10.1 prior to that.  Commended her for excellent blood sugar control.  Additionally, she has lost 9 pounds since her last visit.  She endorses taking semaglutide  as ordered.  Will continue current dosing.  Will hold metformin  at this time and reevaluate A1c in 3 months.  Advised to continue to adhere to a heart healthy diabetic diet and exercise a minimum of 150 minutes weekly as tolerated.      Orders:    POCT glycosylated hemoglobin (Hb A1C)    Microalbumin / creatinine urine ratio; Future    semaglutide  0.25 mg or 0.5 mg (2 mg/3 mL) PnIj; Inject 0.5 mg under the skin every seven (7) days.    Diabetic peripheral neuropathy associated with type 2 diabetes mellitus     Is stable at this time.       Essential hypertension  Her blood pressure is at goal.  She denies chest pain or pressure, lightheadedness or dizziness, shortness of breath, headache.  She will continue lisinopril  daily.  See above for counseling.  Orders:    lisinopril  (PRINIVIL ,ZESTRIL ) 20 MG tablet; Take 1 tablet (20 mg total) by mouth daily.    Dependent edema  Appears euvolemic today.  No dependent edema noted.       Depression, unspecified depression type  Stable with bupropion .  Will continue.  Orders:    buPROPion  (WELLBUTRIN  XL) 300 MG 24 hr tablet; Take 1 tablet (300 mg total) by mouth daily.      Follow-Up: 3 months      SUBJECTIVE:  Chief Complaint   Patient presents with    Diabetes       HPI:  Carol [facility] is a 53 y.o. female who presents to [facility] today for an established patient visit.      I have reviewed the patients problem list, medical, family, and social histories, current medications, and allergies and updated them as needed.    ROS:  -As above in HPI and the Assessment and Plan.    Health Maintenance:   Health Maintenance   Topic Date Due    DTaP/Tdap/Td Vaccines (1 - Tdap) Never done    Zoster Vaccines (1 of 2) Never done    COVID-19 Vaccine (1 - 2024-25 season) Never done    Urine Albumin/Creatinine Ratio  11/21/2023    Hemoglobin A1c  04/04/2024    Influenza Vaccine (1) 05/19/2024    Serum Creatinine Monitoring  09/23/2024    Potassium Monitoring  09/23/2024    Retinal Eye Exam  10/31/2024    Mammogram  12/20/2024    Foot Exam  01/03/2025    Lung Cancer Screening Shared Decision Making  01/18/2025    COPD Spirometry  02/02/2028    Colon Cancer Screening  02/22/2028    Pneumococcal Vaccine 50+  Completed       OBJECTIVE:  Vitals: There were no vitals filed for this visit.          01/04/24 72.8 kg (160 lb 9.6 oz)       Physical Exam:  Constitutional: Well-developed, well-nourished, and in no distress.  HENT:   Head: Normocephalic and atraumatic.   Right Ear: External ear normal.   Left Ear: External ear normal.   Nose: Normal on external  review.   Mouth: Normal on external review.  Eyes: Right eye exhibits no discharge. Left eye exhibits no discharge. No scleral icterus.   Neck: Neck supple.   Cardiovascular: Normal rate.   Pulmonary/Chest: Effort normal. No stridor. No respiratory distress.   Abdominal: No abdominal distension.   Musculoskeletal: General: No deformity.   Neurological: Alert and oriented.   Skin: Skin is dry.   Psychiatric: Mood and affect normal.   Vitals reviewed.      [facility] [place]  [phone]  Telephone [phone]  Fax [phone]

## 2024-04-07 MED ORDER — RIFAXIMIN 550 MG TABLET
ORAL_TABLET | Freq: Two times a day (BID) | ORAL | 5 refills | 30.00000 days | Status: CP
Start: 2024-04-07 — End: ?
  Filled 2024-04-08: qty 60, 30d supply, fill #0

## 2024-04-07 NOTE — Unmapped (Signed)
 Refill request for Xifaxan     Last clinic visit 09/24/23 with labs    Will authorize refill at this time

## 2024-05-02 DIAGNOSIS — R609 Edema, unspecified: Principal | ICD-10-CM

## 2024-05-02 DIAGNOSIS — Z79899 Other long term (current) drug therapy: Principal | ICD-10-CM

## 2024-05-02 DIAGNOSIS — R0601 Orthopnea: Principal | ICD-10-CM

## 2024-05-02 DIAGNOSIS — R110 Nausea: Principal | ICD-10-CM

## 2024-05-02 MED ORDER — ONDANSETRON 4 MG DISINTEGRATING TABLET
ORAL_TABLET | Freq: Three times a day (TID) | ORAL | 0 refills | 15.00000 days | PRN
Start: 2024-05-02 — End: ?

## 2024-05-02 MED ORDER — MAGNESIUM 500 MG (AS MAGNESIUM OXIDE) TABLET
ORAL_TABLET | Freq: Every day | ORAL | 0 refills | 0.00000 days
Start: 2024-05-02 — End: ?

## 2024-05-02 MED ORDER — FUROSEMIDE 20 MG TABLET
ORAL_TABLET | Freq: Every day | ORAL | 3 refills | 90.00000 days
Start: 2024-05-02 — End: 2025-05-02

## 2024-05-02 MED ORDER — POTASSIUM CHLORIDE ER 10 MEQ TABLET,EXTENDED RELEASE(PART/CRYST)
ORAL_TABLET | Freq: Every day | ORAL | 11 refills | 30.00000 days
Start: 2024-05-02 — End: 2025-05-02

## 2024-05-05 MED ORDER — MAGNESIUM 500 MG (AS MAGNESIUM OXIDE) TABLET
ORAL_TABLET | Freq: Every day | ORAL | 0 refills | 0.00000 days | Status: CP
Start: 2024-05-05 — End: ?

## 2024-05-05 MED ORDER — POTASSIUM CHLORIDE ER 10 MEQ TABLET,EXTENDED RELEASE(PART/CRYST)
ORAL_TABLET | Freq: Every day | ORAL | 11 refills | 30.00000 days | Status: CP
Start: 2024-05-05 — End: 2025-05-05

## 2024-05-05 MED ORDER — ONDANSETRON 4 MG DISINTEGRATING TABLET
ORAL_TABLET | Freq: Three times a day (TID) | ORAL | 0 refills | 15.00000 days | Status: CP | PRN
Start: 2024-05-05 — End: ?

## 2024-05-05 MED ORDER — FUROSEMIDE 20 MG TABLET
ORAL_TABLET | Freq: Every day | ORAL | 3 refills | 90.00000 days | Status: CP
Start: 2024-05-05 — End: 2025-05-05

## 2024-05-05 MED FILL — XIFAXAN 550 MG TABLET: ORAL | 28 days supply | Qty: 56 | Fill #1

## 2024-05-05 NOTE — Unmapped (Signed)
 Patient is requesting the following refill  Requested Prescriptions     Pending Prescriptions Disp Refills    furosemide  (LASIX ) 20 MG tablet 90 tablet 3     Sig: Take 1 tablet (20 mg total) by mouth daily. Take an additional 20 mg tablet on Monday, Wednesday, Friday for a total of 40 mg on those days.    magnesium  oxide 500 mg magnesium  Tab 60 tablet 0     Sig: Take 500 mg by mouth daily.    potassium chloride  10 MEQ ER tablet 30 tablet 11     Sig: Take 1 tablet (10 mEq total) by mouth daily.    ondansetron  (ZOFRAN -ODT) 4 MG disintegrating tablet 45 tablet 0     Sig: Take 1 tablet (4 mg total) by mouth every eight (8) hours as needed for nausea.       Recent Visits  Date Type Provider Dept   04/04/24 Office Visit Silver, Powell Chol, FNP [place] [facility]   01/04/24 Office Visit Silver, Powell Chol, FNP [place] [facility]   11/20/23 Office Visit Seena Thom Duncans, MD [place] [facility]   08/20/23 Telemedicine Silver, Powell Chol, FNP [place] [facility]   05/17/23 Office Visit Silver, Powell Chol, FNP [place] [facility]   Showing recent visits within past 365 days and meeting all other requirements  Future Appointments  No visits were found meeting these conditions.  Showing future appointments within next 365 days and meeting all other requirements       Labs:

## 2024-06-02 DIAGNOSIS — E119 Type 2 diabetes mellitus without complications: Principal | ICD-10-CM

## 2024-06-02 DIAGNOSIS — F32A Depression, unspecified depression type: Principal | ICD-10-CM

## 2024-06-02 MED ORDER — PREGABALIN 150 MG CAPSULE
ORAL_CAPSULE | Freq: Three times a day (TID) | ORAL | 5 refills | 30.00000 days
Start: 2024-06-02 — End: ?

## 2024-06-02 MED ORDER — BUPROPION HCL XL 300 MG 24 HR TABLET, EXTENDED RELEASE
ORAL_TABLET | Freq: Every day | ORAL | 3 refills | 90.00000 days
Start: 2024-06-02 — End: 2025-06-02

## 2024-06-02 MED ORDER — SEMAGLUTIDE 0.25 MG OR 0.5 MG (2 MG/3 ML) SUBCUTANEOUS PEN INJECTOR
SUBCUTANEOUS | 3 refills | 28.00000 days
Start: 2024-06-02 — End: ?

## 2024-06-03 MED ORDER — BUPROPION HCL XL 300 MG 24 HR TABLET, EXTENDED RELEASE
ORAL_TABLET | Freq: Every day | ORAL | 3 refills | 90.00000 days | Status: CP
Start: 2024-06-03 — End: 2025-06-03

## 2024-06-03 MED ORDER — PREGABALIN 150 MG CAPSULE
ORAL_CAPSULE | Freq: Three times a day (TID) | ORAL | 5 refills | 30.00000 days | Status: CP
Start: 2024-06-03 — End: ?

## 2024-06-03 MED ORDER — SEMAGLUTIDE 0.25 MG OR 0.5 MG (2 MG/3 ML) SUBCUTANEOUS PEN INJECTOR
SUBCUTANEOUS | 3 refills | 28.00000 days | Status: CP
Start: 2024-06-03 — End: ?

## 2024-06-03 NOTE — Unmapped (Signed)
 Patient is requesting the following refill  Requested Prescriptions     Pending Prescriptions Disp Refills    buPROPion  (WELLBUTRIN  XL) 300 MG 24 hr tablet 90 tablet 3     Sig: Take 1 tablet (300 mg total) by mouth daily.       Recent Visits  Date Type Provider Dept   04/04/24 Office Visit Silver, Powell Chol, FNP [place] [facility]   01/04/24 Office Visit Silver, Powell Chol, FNP [place] [facility]   11/20/23 Office Visit Seena Thom Duncans, MD [place] [facility]   08/20/23 Telemedicine Silver, Powell Chol, FNP [place] [facility]   Showing recent visits within past 365 days and meeting all other requirements  Future Appointments  No visits were found meeting these conditions.  Showing future appointments within next 365 days and meeting all other requirements       Labs: Vitals:   BP Readings from Last 3 Encounters:   04/04/24 130/70   01/04/24 136/76   12/05/23 139/74    and   Pulse Readings from Last 3 Encounters:   04/04/24 112   01/04/24 119   12/05/23 100

## 2024-06-03 NOTE — Unmapped (Signed)
 Patient is requesting the following refill  Requested Prescriptions     Pending Prescriptions Disp Refills    pregabalin  (LYRICA ) 150 MG capsule 90 capsule 5     Sig: Take 1 capsule (150 mg total) by mouth Three (3) times a day.    semaglutide  0.25 mg or 0.5 mg (2 mg/3 mL) PnIj 3 mL 3     Sig: Inject 0.5 mg under the skin every seven (7) days.       Last refill given on: 03/06/24 with 90 count and 5 refills.     Recent Visits  Date Type Provider Dept   04/04/24 Office Visit Silver, Powell Chol, FNP [facility]   01/04/24 Office Visit Silver, Powell Chol, FNP [facility]   11/20/23 Office Visit Seena Thom Duncans, MD [facility]   08/20/23 Telemedicine Silver, Powell Chol, FNP [facility]   Showing recent visits within past 365 days and meeting all other requirements  Future Appointments  No visits were found meeting these conditions.  Showing future appointments within next 365 days and meeting all other requirements       Opioid Monitoring   Urine Tox Screen Last Drug Screen Date: Not Found  Opiate Confirmation Test Last Drug Screen Date: Not Found  Last Opioid Dispensed Provider: Powell Chol Hands, FNP  Prescribed MEDD : 0  Last PDMP Review: 12/27/2022 11:08 AM  Last Opioid Pain Agreement Signed Date: Not Found  Last Non Opioid Controlled Substance Pain Agreement Signed Date: Not Found    Naloxone Ordered: Not Found  Last OV: 04/04/2024

## 2024-06-06 ENCOUNTER — Inpatient Hospital Stay: Admit: 2024-06-06 | Discharge: 2024-06-06 | Payer: Medicaid (Managed Care)

## 2024-06-06 DIAGNOSIS — K7469 Other cirrhosis of liver: Principal | ICD-10-CM

## 2024-06-06 MED ADMIN — gadopiclenol (ELUCIREM,VUEWAY) injection 6 mL: 6 mL | INTRAVENOUS | @ 18:00:00 | Stop: 2024-06-06

## 2024-06-09 DIAGNOSIS — Z09 Encounter for follow-up examination after completed treatment for conditions other than malignant neoplasm: Principal | ICD-10-CM

## 2024-06-09 DIAGNOSIS — Z95828 Presence of other vascular implants and grafts: Principal | ICD-10-CM

## 2024-06-09 DIAGNOSIS — K7469 Other cirrhosis of liver: Principal | ICD-10-CM

## 2024-06-16 MED FILL — XIFAXAN 550 MG TABLET: ORAL | 30 days supply | Qty: 60 | Fill #2

## 2024-06-24 DIAGNOSIS — Z95828 Presence of other vascular implants and grafts: Principal | ICD-10-CM

## 2024-06-24 DIAGNOSIS — K7469 Other cirrhosis of liver: Principal | ICD-10-CM

## 2024-06-24 DIAGNOSIS — K7682 Hepatic encephalopathy    (CMS-HCC): Principal | ICD-10-CM

## 2024-07-02 ENCOUNTER — Inpatient Hospital Stay: Admit: 2024-07-02 | Discharge: 2024-07-02 | Payer: Medicaid (Managed Care)

## 2024-07-07 DIAGNOSIS — Z95828 Presence of other vascular implants and grafts: Principal | ICD-10-CM

## 2024-07-08 NOTE — Unmapped (Signed)
 Phone call to schedule patient  Procedure:TIPS REVISION    Date: 07/17/24  Time:  1040am        check In:940am  Location:[place]     NPO 8 hours prior of food and/ or tube feeds. Clear liquids 2 hours prior  Denies ASA/blood thinners  Must have adult driver come and stay to drive them home  Date, time, location, and insurance confirmed.

## 2024-07-08 NOTE — Unmapped (Signed)
 Phone call to schedule patient  Procedure:TIPS REVISION    Date: 07/16/24  Time:0920am          check In:0820am  Location:[facility] [address]    NPO 8 hours prior of food and/ or tube feeds. Clear liquids 2 hours prior  Denies ASA/blood thinners  Must have adult driver come and stay to drive them home  Date, time, location, and insurance confirmed.

## 2024-07-15 NOTE — Telephone Encounter (Signed)
 VIR pre call attempt.  NA x 2.  Not accepting calls at this time.

## 2024-07-16 NOTE — Telephone Encounter (Signed)
 VIR pre TIPS revision prep call completed. Reviewed to register at 0940 [facility] then [facility].  Informed of no show/late cancellation policy. NPO guidelines reviewed. Pt OK to take sips of clear liquids with all AM meds.  Pt aware of need for driver >54 years of age able to stay throughout procedure and recovery. Made aware of visitation policy.  Pt verbalized understanding. All questions answered.

## 2024-07-17 ENCOUNTER — Inpatient Hospital Stay: Admit: 2024-07-17 | Discharge: 2024-07-17 | Payer: Medicaid (Managed Care)

## 2024-07-17 DIAGNOSIS — Z95828 Presence of other vascular implants and grafts: Principal | ICD-10-CM

## 2024-07-17 LAB — PROTIME-INR
INR: 1.51
PROTIME: 17.2 s — ABNORMAL HIGH (ref 9.9–12.6)

## 2024-07-17 LAB — CBC
HEMATOCRIT: 34 % (ref 34.0–44.0)
HEMOGLOBIN: 11.6 g/dL (ref 11.3–14.9)
MEAN CORPUSCULAR HEMOGLOBIN CONC: 34 g/dL (ref 32.0–36.0)
MEAN CORPUSCULAR HEMOGLOBIN: 29 pg (ref 25.9–32.4)
MEAN CORPUSCULAR VOLUME: 85.2 fL (ref 77.6–95.7)
MEAN PLATELET VOLUME: 9.1 fL (ref 6.8–10.7)
PLATELET COUNT: 91 10*9/L — ABNORMAL LOW (ref 150–450)
RED BLOOD CELL COUNT: 3.99 10*12/L (ref 3.95–5.13)
RED CELL DISTRIBUTION WIDTH: 17.3 % — ABNORMAL HIGH (ref 12.2–15.2)
WBC ADJUSTED: 6.1 10*9/L (ref 3.6–11.2)

## 2024-07-17 LAB — COMPREHENSIVE METABOLIC PANEL
ALBUMIN: 2 g/dL — ABNORMAL LOW (ref 3.4–5.0)
ALKALINE PHOSPHATASE: 141 U/L — ABNORMAL HIGH (ref 46–116)
ALT (SGPT): 30 U/L (ref 10–49)
ANION GAP: 12 mmol/L (ref 5–14)
AST (SGOT): 46 U/L — ABNORMAL HIGH (ref <=34)
BILIRUBIN TOTAL: 1.2 mg/dL (ref 0.3–1.2)
BLOOD UREA NITROGEN: <5 mg/dL — ABNORMAL LOW (ref 9–23)
CALCIUM: 9 mg/dL (ref 8.7–10.4)
CHLORIDE: 95 mmol/L — ABNORMAL LOW (ref 98–107)
CO2: 29 mmol/L (ref 20.0–31.0)
CREATININE: 0.79 mg/dL (ref 0.55–1.02)
EGFR CKD-EPI (2021) FEMALE: 89 mL/min/1.73m2 (ref >=60)
GLUCOSE RANDOM: 285 mg/dL — ABNORMAL HIGH (ref 70–99)
POTASSIUM: 2.7 mmol/L — ABNORMAL LOW (ref 3.4–4.8)
PROTEIN TOTAL: 8 g/dL (ref 5.7–8.2)
SODIUM: 136 mmol/L (ref 135–145)

## 2024-07-17 MED ADMIN — midazolam (VERSED) injection: INTRAVENOUS | @ 15:00:00 | Stop: 2024-07-17

## 2024-07-17 MED ADMIN — fentaNYL (PF) (SUBLIMAZE) injection: INTRAVENOUS | @ 15:00:00 | Stop: 2024-07-17

## 2024-07-17 MED ADMIN — potassium chloride (KLOR-CON) packet 40 mEq: 40 meq | ORAL | @ 17:00:00 | Stop: 2024-07-17

## 2024-07-17 MED ADMIN — lidocaine (PF) (XYLOCAINE-MPF) 10 mg/mL (1 %) injection: SUBCUTANEOUS | @ 15:00:00 | Stop: 2024-07-17

## 2024-07-17 MED ADMIN — midazolam (VERSED) injection 1 mg: 1 mg | INTRAVENOUS | @ 15:00:00 | Stop: 2024-07-17

## 2024-07-17 NOTE — Op Note (Signed)
 [place] [facility] - Post-Operative Note     VIR Post-Procedure Note    Procedure Name: tips revision    Pre-Op Diagnosis: tips stenosis    Post-Op Diagnosis: Same as pre-operative diagnosis    VIR Providers    Attending: Alm CHRISTELLA Morris, MD    Description of procedure: Successful ultrasound and fluoroscopy guided tips revision.  Central stenosis noted with successful balloon dilation to 8 mm.  Decrease in gradient from 5 to 3.     Post-procedure follow-up: 1 hour bed rest    Sedation: Moderate sedation    Estimated Blood Loss: approximately 2 mL  Complications: None    See detailed procedure note with images in PACS.    The patient tolerated the procedure well without incident or complication and left the room in stable condition.    Alm CHRISTELLA Morris, MD  07/17/2024 12:04 PM

## 2024-07-17 NOTE — H&P (Signed)
 [place] [facility] - Pre Procedure H/P      Assessment/Plan:    Carol Arias is a 53 y.o. female who will undergo TIPS revision in Interventional Radiology.    --This procedure has been fully reviewed with the patient/patient???s authorized representative. The risks, benefits and alternatives have been explained, and the patient/patient???s authorized representative has consented to the procedure.  --The patient will accept blood products in an emergent situation.  --The patient does not have a Do Not Resuscitate order in effect.      HPI: Carol Arias is a 53 y.o. 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      Allergies: Allergies[1]    Medications:  No relevant medications, please see full medication list in Epic.    ASA Grade: ASA 3 - Patient with moderate systemic disease with functional limitations    PE:    Vitals:    07/17/24 0955   BP: 112/77   Pulse: 120   Temp: 36.8 ??C (98.2 ??F)   SpO2: 96%     General: female in NAD.  Airway assessment: Class 1 - Can visualize soft palate, fauces, uvula, and tonsillar pillars  Lungs: Respirations nonlabored        Carol Slates, MD  VIR Resident, PGY6        [1]   Allergies  Allergen Reactions    Amitriptyline Hallucinations and Confusion     Hallucinations    Acetaminophen Itching

## 2024-07-18 NOTE — Telephone Encounter (Signed)
 VIR post call initiated. No response from pt.

## 2024-07-22 DIAGNOSIS — H5712 Ocular pain, left eye: Principal | ICD-10-CM

## 2024-07-22 NOTE — Progress Notes (Signed)
 [facility] Encounter  This medical encounter was conducted virtually using [email or web address]  Tele[employer] protocols.      Carol Arias is 53 y.o. and presents today in the [facility] with symptoms.  The PCP for this patient is Silver, Powell Chol, FNP.     Left eye redness, pain, upper lid swelling.  Feels a pulsating throbbing behind the eye.  Started 3 days ago.  Using warm and cold compress, without benefit.  Wears glasses, no contacts.     Recommend a in-person visit today.  She can start with a local urgent care since after 5.  Patient verbalizes understanding and has no further questions at this time.

## 2024-08-01 ENCOUNTER — Inpatient Hospital Stay: Admit: 2024-08-01 | Discharge: 2024-08-01 | Payer: Medicaid (Managed Care)

## 2024-08-01 ENCOUNTER — Encounter
Admit: 2024-08-01 | Discharge: 2024-08-01 | Payer: Medicaid (Managed Care) | Attending: Student in an Organized Health Care Education/Training Program | Primary: Student in an Organized Health Care Education/Training Program

## 2024-08-01 DIAGNOSIS — R110 Nausea: Principal | ICD-10-CM

## 2024-08-01 DIAGNOSIS — E119 Type 2 diabetes mellitus without complications: Principal | ICD-10-CM

## 2024-08-01 DIAGNOSIS — H5712 Ocular pain, left eye: Principal | ICD-10-CM

## 2024-08-01 DIAGNOSIS — E876 Hypokalemia: Principal | ICD-10-CM

## 2024-08-01 LAB — HEMOGLOBIN A1C
ESTIMATED AVERAGE GLUCOSE: 194 mg/dL
HEMOGLOBIN A1C: 8.4 % — ABNORMAL HIGH (ref 4.8–5.6)

## 2024-08-01 LAB — BASIC METABOLIC PANEL
ANION GAP: 6 mmol/L (ref 5–14)
BLOOD UREA NITROGEN: 5 mg/dL — ABNORMAL LOW (ref 7–21)
BUN / CREAT RATIO: 7
CALCIUM: 8.9 mg/dL (ref 8.5–10.2)
CHLORIDE: 99 mmol/L (ref 98–107)
CO2: 25 mmol/L (ref 22.0–32.0)
CREATININE: 0.7 mg/dL (ref 0.60–1.00)
EGFR CKD-EPI (2021) FEMALE: >90 mL/min/1.73m2 (ref >=60)
GLUCOSE RANDOM: 476 mg/dL — ABNORMAL HIGH (ref 74–106)
POTASSIUM: 3.4 mmol/L — ABNORMAL LOW (ref 3.5–5.0)
SODIUM: 130 mmol/L — ABNORMAL LOW (ref 135–145)

## 2024-08-01 MED ORDER — ONDANSETRON 4 MG DISINTEGRATING TABLET
ORAL_TABLET | Freq: Three times a day (TID) | ORAL | 0 refills | 15.00000 days | Status: CP | PRN
Start: 2024-08-01 — End: ?

## 2024-08-01 NOTE — Progress Notes (Signed)
 [name] is a 53 y.o. 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      ASSESSMENT/PLAN:    Assessment & Plan  Left eye pain  Acute pain with conjunctivitis and photosensitivity in the left eye.  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  She presents today for a referral to ophthalmology for evaluation.  She will need urgent referral for pain and photosensitivity,   Orders:    Ambulatory referral to Ophthalmology; Future    Hypokalemia  Last K+ 2.7 approx 2 weeks ago when she underwent TIPS revision.   Orders:    Basic metabolic panel; Future    Type 2 diabetes mellitus without complication, without long-term current use of insulin (CMS-HCC)  Lab Results   Component Value Date    A1C 5.6 04/04/2024   Well-controlled on Ozempic  0.5 mg weekly, though it does seem to cause some nausea.  Higher dose caused more severe GI symptoms.  Will continue for now, but schedule follow-up in 1 month for further discussion and alternatives for diabetes treatment.  Orders:    Hemoglobin A1c; Future    Nausea  Chronic condition, takes Zofran  once or twice daily as needed.  Possibly medication side effect.  Patient requested refill of Zofran  today.  Orders:    ondansetron  (ZOFRAN -ODT) 4 MG disintegrating tablet; Take 1 tablet (4 mg total) by mouth every eight (8) hours as needed for nausea.      Chief Complaint   Patient presents with    Eye Pain     Left eye irritated eyelid with blisters - went to ED [facility]) 07/26/2024 did not prescribe any meds        HPI:    [name] is a 53 y.o. female who presents to clinic today regarding the following issues:    History of Present Illness  Carol Arias is a 53 year old female who presents with left eye pain, redness, and light sensitivity.    She has been experiencing symptoms in her left eye, including redness, pain, and sensitivity to light, for the past two weeks. The pain is described as a 'headache on the backside' of the eye, with a sensation of 'a piece of plastic' stuck in the eye. Despite having no vision in the left eye due to a previous eye stroke last year, she experiences significant discomfort when exposed to light, necessitating keeping the eye closed when outside.    This is the fourth occurrence of these symptoms in the past eight months, with previous episodes resolving after a few days. However, this episode has persisted longer, prompting her to visit the emergency room. There, she received numbing drops and had her eye pressure checked, although the machine malfunctioned. She has faced challenges with transportation and scheduling due to limited availability of providers accepting Medicaid.    She has a history of a TIPS revision procedure performed on October 30th, 2025, and is currently on Ozempic  0.5 mg weekly for diabetes management. She experienced significant weight loss and nausea after the dose was increased from 2 mg to 5 mg, but the nausea has since lessened. She is not on any other diabetes medications.    Her social history includes being recently awarded disability due to health issues, and she previously enjoyed using her phone, but now finds it difficult due  to her eye condition and hand issues. She is currently working with a program to find solutions for her medication needs, as her Xifaxan  is no longer covered by Medicaid.      HISTORY:  I have reviewed the patients problem list, current medications, allergies, and social history and updated them as needed.    Social History     Social History Narrative    12/03/2017        [facility] Components:        Family, social, cultural characteristics: financial issues - lost insurance  .      Patient has the following communication needs: vision issue     Health Literacy: How confident are you that you understand your health issues/concerns, can participate in your care, and manage your care along with your physician: confident.    Behaviors Affecting Health: none, per patient    Family history of mental health illness and/or substance abuse: none per patient.    Have you been seen by any medical provider that we have not referred you to since your last visit ? No    Discussed a Living Will with the patient and uyz:Ejupzwu is under the age of 73.                 Lives in [place] with husband and daughter.       Health Maintenance   Topic Date Due    DTaP/Tdap/Td Vaccines (1 - Tdap) Never done    Zoster Vaccines (1 of 2) Never done    COVID-19 Vaccine (1 - 2025-26 season) Never done    Influenza Vaccine (1) 05/19/2024    Hemoglobin A1c  07/05/2024    Retinal Eye Exam  10/31/2024    Mammogram  12/20/2024    Foot Exam  01/03/2025    Lung Cancer Screening Shared Decision Making  01/18/2025    Urine Albumin/Creatinine Ratio  04/04/2025    Lipid Screening  05/18/2025    Serum Creatinine Monitoring  07/17/2025    Potassium Monitoring  07/17/2025    COPD Spirometry  02/02/2028    Colon Cancer Screening  02/22/2028    Pneumococcal Vaccine 50+  Completed       [name]  reports that she has been smoking cigarettes and e-cigarettes. She has a 30 pack-year smoking history. She has been exposed to tobacco smoke. She has never used smokeless tobacco.    OBJECTIVE:    BP 124/60 (BP Site: L Arm, BP Position: Sitting, BP Cuff Size: Large)  - Pulse 78  - Temp 36.7 ??C (98 ??F) (Oral)  - Resp 18  - Wt 64.3 kg (141 lb 12.8 oz)  - SpO2 99%  - BMI 23.60 kg/m??     GENERAL: Well-developed, well-appearing, NAD.  HEENT: Left eye with conjunctivitis and textured appearance and mild swelling and erythema of the eyelids.  CV: RRR, no murmurs, rubs or gallops,  RESP: CTAB, no wheezes, crackles or rhonchi, good air movement with normal WOB  SKIN: No rash, no acute lesions  EXTREM: No LE edema.   NEURO: No focal deficits  PSYCH: Affect appropriate. Speech linear, goal-directed.     [name], MD

## 2024-08-08 ENCOUNTER — Encounter: Admit: 2024-08-08 | Discharge: 2024-08-08 | Payer: Medicaid (Managed Care) | Attending: Family | Primary: Family

## 2024-08-08 DIAGNOSIS — E119 Type 2 diabetes mellitus without complications: Principal | ICD-10-CM

## 2024-08-08 DIAGNOSIS — F32A Depression, unspecified depression type: Principal | ICD-10-CM

## 2024-08-08 DIAGNOSIS — E876 Hypokalemia: Principal | ICD-10-CM

## 2024-08-08 DIAGNOSIS — E871 Hypo-osmolality and hyponatremia: Principal | ICD-10-CM

## 2024-08-08 DIAGNOSIS — B370 Candidal stomatitis: Principal | ICD-10-CM

## 2024-08-08 LAB — BASIC METABOLIC PANEL
ANION GAP: 6 mmol/L (ref 5–14)
BLOOD UREA NITROGEN: 4 mg/dL — ABNORMAL LOW (ref 7–21)
BUN / CREAT RATIO: 6
CALCIUM: 8.6 mg/dL (ref 8.5–10.2)
CHLORIDE: 101 mmol/L (ref 98–107)
CO2: 25 mmol/L (ref 22.0–32.0)
CREATININE: 0.7 mg/dL (ref 0.60–1.00)
EGFR CKD-EPI (2021) FEMALE: >90 mL/min/1.73m2 (ref >=60)
GLUCOSE RANDOM: 438 mg/dL — ABNORMAL HIGH (ref 74–106)
POTASSIUM: 3.7 mmol/L (ref 3.5–5.0)
SODIUM: 132 mmol/L — ABNORMAL LOW (ref 135–145)

## 2024-08-08 MED ORDER — INSULIN GLARGINE (U-100) 100 UNIT/ML (3 ML) SUBCUTANEOUS PEN
Freq: Every evening | SUBCUTANEOUS | 1 refills | 115.00000 days | Status: CP
Start: 2024-08-08 — End: 2025-02-04

## 2024-08-08 MED ORDER — NYSTATIN 100,000 UNIT/ML ORAL SUSPENSION
Freq: Four times a day (QID) | ORAL | 0 refills | 3.00000 days | Status: CP
Start: 2024-08-08 — End: ?

## 2024-08-08 NOTE — Progress Notes (Signed)
 Assessment and Plan:   Carol Arias presents for follow up appointment.    Assessment & Plan  Type 2 diabetes mellitus without complication, without long-term current use of insulin  (CMS-HCC)  Severe hyperglycemia with blood sugars in the 400s, reaching 509 mg/dL. Previous intolerance to Ozempic  and metformin . High intake of sugary drinks and milk. Discussed potential organ damage from uncontrolled hyperglycemia. Insulin  therapy necessary.  - Started insulin  glargine (Lantus ) at 0.2 units/kg per day, approximately 13 units at bedtime.  - Referred to endocrinology for further management.  - Referred to nutritionist for dietary counseling.  - Ordered repeat metabolic panel to check blood sugar, sodium, and potassium levels.  Orders:    Amb Referral to Diabetes Ed/Med Nutr The; Future    Basic metabolic panel; Future    Ambulatory referral to Endocrinology; Future    insulin  glargine (BASAGLAR , LANTUS ) 100 unit/mL (3 mL) injection pen; Inject 0.13 mL (13 Units total) under the skin nightly.    Depression, unspecified depression type  Managed with Wellbutrin  300 mg daily. Recent stressors include loss of her son.  - Continue Wellbutrin  300 mg daily.       Hypokalemia  Previous labs showed low sodium (130 mmol/L) and borderline low potassium (3.4 mmol/L).  - Ordered repeat metabolic panel to reassess sodium and potassium levels.  Orders:    Basic metabolic panel; Future    Hyponatremia  Previous labs showed low sodium (130 mmol/L) and borderline low potassium (3.4 mmol/L).  - Ordered repeat metabolic panel to reassess sodium and potassium levels.  Orders:    Basic metabolic panel; Future    Thrush  Presence of oral thrush with oral discomfort and visible thrush.  - Prescribed nystatin  oral suspension, 4 times a day until resolution.  Orders:    nystatin  (MYCOSTATIN ) 100,000 unit/mL suspension; Take 5 mL (500,000 Units total) by mouth four (4) times a day.      Assessment & Plan       Return in about 2 weeks (around 08/22/2024) for Diabetes f/u.     Subjective:       History of Present Illness  Carol Arias is a 53 year old female with diabetes who presents with uncontrolled blood sugar levels and medication intolerance.    She has been experiencing uncontrolled blood sugar levels, with recent readings as high as 501 mg/dL in the morning and 597 mg/dL in the evening. Her hemoglobin A1c is 8.4. She was previously on Ozempic  but discontinued it due to nausea and sickness. She also stopped metformin . She has not been on insulin  before. She consumes large amounts of soft drinks, specifically [facility], but has reduced her intake to one 12-ounce can per day. She also consumes whole milk, sometimes frozen, due to a preference for its coldness. She is also taking Lactulose  20 grams daily which may be contributing to her elevated glucose level.     She has a history of gastroparesis and liver issues, which complicate her ability to keep food down. She has difficulty with certain foods, stating she can determine whether she can tolerate them just by looking at them. She experiences nausea and vomiting, which may be related to her gastroparesis. She is currently taking Zofran  for nausea management.    She reports symptoms of thrush, which she attributes to being off Xifaxan . She describes a deep pain around her liver area, which has not subsided. She is not currently taking Xifaxan  due to insurance issues.    She has experienced  significant personal stress, having recently lost her 20 year old son, Carol Arias, who had a history of seizures following a work-related injury. He passed away after being admitted for double pneumonia and subsequently having a severe seizure.    She is currently taking Wellbutrin  300 mg daily, lactulose  30 grams three times a day, lisinopril  20 mg, magnesium  oxide 500 mg, a multivitamin, Zofran  for nausea, pantoprazole  for reflux, potassium chloride , and Lyrica  as needed for neuropathy. She is not taking Lasix  or Xifaxan  due to insurance issues.     ROS:   Review of systems negative unless otherwise noted as per HPI.      Objective:          08/08/24 1040   BP: 124/72   Pulse: 102   Resp: 18   Temp: 36.7 ??C (98 ??F)   SpO2: 98%   Weight: 65.3 kg (144 lb)   Height: 165.1 cm (5' 5)   PainSc: 0-No pain       Physical Exam:  Physical Exam  Constitutional:       Appearance: Normal appearance.   HENT:      Head: Normocephalic.      Nose: Nose normal.      Mouth/Throat:      Mouth: Mucous membranes are moist.      Comments: Oral candidiasis noted on tongue.   Eyes:      Conjunctiva/sclera: Conjunctivae normal.   Cardiovascular:      Rate and Rhythm: Normal rate and regular rhythm.      Pulses: Normal pulses.   Pulmonary:      Effort: Pulmonary effort is normal.   Abdominal:      General: Bowel sounds are normal.      Palpations: Abdomen is soft.   Musculoskeletal:         General: Normal range of motion.      Cervical back: Normal range of motion.   Skin:     General: Skin is warm and dry.      Capillary Refill: Capillary refill takes less than 2 seconds.   Neurological:      General: No focal deficit present.      Mental Status: She is alert and oriented to person, place, and time.   Psychiatric:         Mood and Affect: Mood normal.         Behavior: Behavior normal.             Sharlet KATHEE Stanley, FNP

## 2024-08-08 NOTE — Patient Instructions (Signed)
 Take 13 units of insulin  (via pen) nightly    [facility] will call to schedule an appointment.     [facility] in [place] will call to schedule an appointment.

## 2024-08-11 NOTE — Telephone Encounter (Signed)
 Pt informed of results and recommendations per Sharlet Stanley FNP. Pt verbalized understanding. Pt reports she went to pharmacy and was told insulin  is out of stock to pick up on Tuesday (tomorrow). Pt states she has been checking blood sugars have been 150s-200s but reports she has not been able to eat much.

## 2024-08-11 NOTE — Telephone Encounter (Signed)
-----   Message from Sharlet Stanley, FNP sent at 08/08/2024  2:31 PM EST -----  Please let patient know that her sodium is still low but slightly improved. Her potassium is normal. Her blood glucose is 438. She needs to avoid all soft drinks and decrease carbohydrates. She needs   to let us  know if she is unable to obtain her insulin  from the pharmacy.  If blood sugars remain this elevated, she should go to the emergency department.

## 2024-08-12 DIAGNOSIS — E119 Type 2 diabetes mellitus without complications: Principal | ICD-10-CM

## 2024-08-12 MED ORDER — PEN NEEDLE, DIABETIC 29 GAUGE X 1/2" (12 MM)
ORAL | 11 refills | 0.00000 days | Status: CP
Start: 2024-08-12 — End: 2025-08-12

## 2024-08-23 NOTE — Telephone Encounter (Signed)
 Archived PA request for Xifaxan  in Cover My Meds (Key: [id number])--> drug is no longer covered by Medicaid--> [place] MAP team has been in touch with the patient regarding manufacturer assistance

## 2024-08-29 ENCOUNTER — Encounter: Admit: 2024-08-29 | Discharge: 2024-08-29 | Payer: Medicaid (Managed Care)

## 2024-08-29 DIAGNOSIS — Z23 Encounter for immunization: Principal | ICD-10-CM

## 2024-08-29 DIAGNOSIS — E1169 Type 2 diabetes mellitus with other specified complication: Principal | ICD-10-CM

## 2024-08-29 DIAGNOSIS — I10 Essential (primary) hypertension: Principal | ICD-10-CM

## 2024-08-29 DIAGNOSIS — B370 Candidal stomatitis: Principal | ICD-10-CM

## 2024-08-29 DIAGNOSIS — Z794 Long term (current) use of insulin: Principal | ICD-10-CM

## 2024-08-29 LAB — BASIC METABOLIC PANEL
ANION GAP: 5 mmol/L (ref 5–14)
BLOOD UREA NITROGEN: 4 mg/dL — ABNORMAL LOW (ref 7–21)
BUN / CREAT RATIO: 7
CALCIUM: 8.2 mg/dL — ABNORMAL LOW (ref 8.5–10.2)
CHLORIDE: 101 mmol/L (ref 98–107)
CO2: 25 mmol/L (ref 22.0–32.0)
CREATININE: 0.6 mg/dL (ref 0.60–1.00)
EGFR CKD-EPI (2021) FEMALE: >90 mL/min/1.73m2 (ref >=60)
GLUCOSE RANDOM: 393 mg/dL — ABNORMAL HIGH (ref 74–106)
POTASSIUM: 3.9 mmol/L (ref 3.5–5.0)
SODIUM: 131 mmol/L — ABNORMAL LOW (ref 135–145)

## 2024-08-29 MED ORDER — FLUCONAZOLE 150 MG TABLET
ORAL_TABLET | ORAL | 0 refills | 6.00000 days | Status: CP
Start: 2024-08-29 — End: ?

## 2024-08-29 MED ORDER — LISINOPRIL 20 MG TABLET
ORAL_TABLET | Freq: Every day | ORAL | 1 refills | 180.00000 days | Status: CP
Start: 2024-08-29 — End: ?

## 2024-08-29 MED ORDER — NYSTATIN 100,000 UNIT/ML ORAL SUSPENSION
Freq: Four times a day (QID) | ORAL | 0 refills | 3.00000 days | Status: CP
Start: 2024-08-29 — End: ?

## 2024-08-29 MED ORDER — DEXCOM G7 SENSOR DEVICE
11 refills | 0.00000 days | Status: CP
Start: 2024-08-29 — End: ?

## 2024-08-29 MED ORDER — BLOOD-GLUCOSE METER KIT WRAPPER
ORAL | 0 refills | 0.00000 days | Status: CP
Start: 2024-08-29 — End: 2026-10-06

## 2024-08-29 NOTE — Assessment & Plan Note (Addendum)
 Her blood pressure is at goal.  She denies chest pain or pressure, lightheadedness or dizziness, shortness of breath.  Advised to continue current regimen of lisinopril .  Refills submitted.  Advised to adhere to a heart healthy diabetic diet and exercise a minimum of 150 minutes weekly.  Questions encouraged and answered.  Understanding verbalized.  Orders:    lisinopril  (PRINIVIL ,ZESTRIL ) 20 MG tablet; Take 1 tablet (20 mg total) by mouth daily.

## 2024-08-29 NOTE — Patient Instructions (Addendum)
 Diflucan . One capsule every other day for 3 doses.    I have ordered Dexcom 7 sensors    I have ordered a new blood glucose meter kit.    Referral to [facility] for diabetes education and Dexcom.    Try Zero Sugar [employer] in place of regular [place]. Dew.     Continue to drink lemon water    Milk has a lot of sugar in it.     Return in February to see [facility]

## 2024-08-29 NOTE — Progress Notes (Signed)
  Primary Care at [facility] Patient Clinic Note    Assessment & Plan  Type 2 diabetes mellitus with other specified complication, with long-term current use of insulin  (CMS-HCC)  Carol Arias was here for diabetes follow-up as she recently started insulin .  She endorses taking insulin  as ordered which is Lantus  13 units at bedtime.  I am hesitant to adjust this dose as her blood sugar range has been wide.  Fasting blood sugars range:  11/24: 469  11/25: 171  11/16: 427  11/26-12/2: Machine not functioning  12/2: 128  12/3: 343  12/4: 135  12/5: 289  12/6: 205  12/7: 565  12/8: 414  12/9: 273  12/10: 473  No machine for 2 days due to breaking.    Updating BMP today as we should certainly survey her sodium and potassium as well as blood glucose.    I have ordered Dexcom G7 as she has now a long-term insulin  awaiting appointment with endocrinology in April.  Additionally, referring to Carol Arias for diabetes education and CGM assistance.    She already has a referral for nutrition with Carol Arias in house.    Is now drinking only 12 oz of regular soda daily. However, she admits to drinking cranberry pomegranate juice. We had a discussion that juice is very high in sugar. Advised to avoid juices and regular sodas.  She is agreeable to trial substitution of soda with 0 sugar soda and to increase her water intake.  We also discussed that whole milk is high in sugar.  She reports that she has been drinking a cup of milk nightly.  Advised to avoid.  Discussed potential switch to unsweetened almond milk.    She denies associated symptoms of hyperglycemia.  Notes that she does not feel any different when her blood sugar is in the 100s versus 400s though, she does endorse increased frequency of headaches when her blood sugar is elevated.    She will return in 2 months which is when her A1c can be updated for a visit with Carol Arias.  Emergency precautions discussed.  Questions encouraged and answered. Understanding verbalized.      Need for influenza vaccination  Orders:    INFLUENZA VACCINE IIV3(IM)(PF)6 MOS UP    Essential hypertension  Her blood pressure is at goal.  She denies chest pain or pressure, lightheadedness or dizziness, shortness of breath.  Advised to continue current regimen of lisinopril .  Refills submitted.  Advised to adhere to a heart healthy diabetic diet and exercise a minimum of 150 minutes weekly.  Questions encouraged and answered.  Understanding verbalized.  Orders:    lisinopril  (PRINIVIL ,ZESTRIL ) 20 MG tablet; Take 1 tablet (20 mg total) by mouth daily.    Thrush  She does have a recurrence of thrush to her tongue.  Orders:    fluconazole  (DIFLUCAN ) 150 MG tablet; Take 1 tablet (150 mg total) by mouth every other day.    nystatin  (MYCOSTATIN ) 100,000 unit/mL suspension; Take 5 mL (500,000 Units total) by mouth four (4) times a day.      Follow-Up: 2 months with Carol Arias      SUBJECTIVE:  Chief Complaint   Patient presents with    Diabetes     She states that her blood sugars are still elevated       HPI:  Carol Arias is a 53 y.o. female who presents to clinic today for an established patient visit.       I have  reviewed the patients problem list, medical, family, and social histories, current medications, and allergies and updated them as needed.    ROS:  -As above in HPI and the Assessment and Plan.    Health Maintenance:   Health Maintenance   Topic Date Due    DTaP/Tdap/Td Vaccines (1 - Tdap) Never done    Zoster Vaccines (1 of 2) Never done    COVID-19 Vaccine (1 - 2025-26 season) Never done    Influenza Vaccine (1) 05/19/2024    Retinal Eye Exam  10/31/2024    Hemoglobin A1c  11/01/2024    Mammogram  12/20/2024    Foot Exam  01/03/2025    Lung Cancer Screening Shared Decision Making  01/18/2025    Urine Albumin/Creatinine Ratio  04/04/2025    Lipid Screening  05/18/2025    Serum Creatinine Monitoring  08/08/2025    Potassium Monitoring  08/08/2025    COPD Spirometry 02/02/2028    Colon Cancer Screening  02/22/2028    Pneumococcal Vaccine 50+  Completed       OBJECTIVE:  Vitals:   Vitals:    08/29/24 1057   Pulse: 119   Temp: 37 ??C (98.6 ??F)   SpO2: 97%             08/29/24 69.3 kg (152 lb 12.8 oz)       Physical Exam:  Constitutional: Well-developed, well-nourished, and in no distress.  HENT:   Head: Normocephalic and atraumatic.   Right Ear: External ear normal.   Left Ear: External ear normal.   Nose: Normal on external review.   Mouth: Normal on external review.  Thrush noted to tongue  Eyes: Right eye exhibits no discharge. Left eye exhibits no discharge. No scleral icterus.   Neck: Neck supple.   Cardiovascular: Normal rate.   Pulmonary/Chest: Effort normal. No stridor. No respiratory distress.   Abdominal: No abdominal distension.   Musculoskeletal: General: No deformity.   Neurological: Alert and oriented.   Skin: Skin is dry.   Psychiatric: Mood and affect normal.   Vitals reviewed.      [facility] [place]  [phone]  Telephone [phone]  Fax [phone]

## 2024-09-16 ENCOUNTER — Ambulatory Visit: Admit: 2024-09-16 | Payer: Medicaid (Managed Care)

## 2024-10-06 DIAGNOSIS — R110 Nausea: Principal | ICD-10-CM

## 2024-10-06 MED ORDER — ONDANSETRON 4 MG DISINTEGRATING TABLET
ORAL_TABLET | Freq: Three times a day (TID) | ORAL | 0 refills | 15.00000 days | PRN
Start: 2024-10-06 — End: ?

## 2024-10-06 MED ORDER — PANTOPRAZOLE 40 MG TABLET,DELAYED RELEASE
ORAL_TABLET | Freq: Every day | ORAL | 1 refills | 90.00000 days
Start: 2024-10-06 — End: ?

## 2024-10-07 MED ORDER — ONDANSETRON 4 MG DISINTEGRATING TABLET
ORAL_TABLET | Freq: Three times a day (TID) | ORAL | 0 refills | 15.00000 days | Status: CP | PRN
Start: 2024-10-07 — End: ?

## 2024-10-07 MED ORDER — PANTOPRAZOLE 40 MG TABLET,DELAYED RELEASE
ORAL_TABLET | Freq: Every day | ORAL | 1 refills | 90.00000 days | Status: CP
Start: 2024-10-07 — End: ?

## 2024-10-07 NOTE — Telephone Encounter (Signed)
 Patient is requesting the following refill  Requested Prescriptions     Pending Prescriptions Disp Refills    pantoprazole  (PROTONIX ) 40 MG tablet 90 tablet 1     Sig: Take 1 tablet (40 mg total) by mouth daily before breakfast.       Recent Visits  Date Type Provider Dept   08/29/24 Office Visit Silver, Powell Chol, FNP [place] [facility]   08/08/24 Office Visit Lattimore, Sharlet Dubonnet, FNP [place] [facility]   08/01/24 Office Visit Cleotilde, Comer Pyo, MD [place] [facility]   07/22/24 Telemedicine Tobie, Buster Opoka, FNP [facility]   04/04/24 Office Visit Silver, Powell Chol, FNP [place] [facility]   01/04/24 Office Visit Silver, Powell Chol, FNP [place] [facility]   11/20/23 Office Visit Seena Thom Duncans, MD [place] [facility]   Showing recent visits within past 365 days and meeting all other requirements  Future Appointments  Date Type Provider Dept   10/31/24 Appointment Lattimore, Sharlet Dubonnet, FNP [place] [facility]   Showing future appointments within next 365 days and meeting all other requirements       Labs: Not applicable this refill

## 2024-10-07 NOTE — Telephone Encounter (Signed)
 Patient is requesting the following refill  Requested Prescriptions     Pending Prescriptions Disp Refills    ondansetron  (ZOFRAN -ODT) 4 MG disintegrating tablet 45 tablet 0     Sig: Take 1 tablet (4 mg total) by mouth every eight (8) hours as needed for nausea.       Recent Visits  Date Type Provider Dept   08/29/24 Office Visit Silver, Powell Chol, FNP [place] [facility]   08/08/24 Office Visit Lattimore, Sharlet Dubonnet, FNP [place] [facility]   08/01/24 Office Visit Cleotilde, Comer Pyo, MD [place] [facility]   07/22/24 Telemedicine Tobie, Buster Opoka, FNP [facility]   04/04/24 Office Visit Silver, Powell Chol, FNP [place] [facility]   01/04/24 Office Visit Silver, Powell Chol, FNP [place] [facility]   11/20/23 Office Visit Seena Thom Duncans, MD [place] [facility]   Showing recent visits within past 365 days and meeting all other requirements  Future Appointments  Date Type Provider Dept   10/31/24 Appointment Lattimore, Sharlet Dubonnet, FNP [place] [facility]   Showing future appointments within next 365 days and meeting all other requirements       Labs: Not applicable this refill

## 2024-10-10 DIAGNOSIS — K7682 Hepatic encephalopathy    (CMS-HCC): Secondary | ICD-10-CM

## 2024-10-10 DIAGNOSIS — K7469 Other cirrhosis of liver: Secondary | ICD-10-CM

## 2024-10-10 DIAGNOSIS — Z95828 Presence of other vascular implants and grafts: Principal | ICD-10-CM

## 2024-10-10 MED ORDER — RIFAXIMIN 550 MG TABLET
ORAL_TABLET | Freq: Two times a day (BID) | ORAL | 3 refills | 90.00000 days | Status: CP
Start: 2024-10-10 — End: 2024-10-10

## 2024-10-10 NOTE — Telephone Encounter (Signed)
 Left mesg Jan 26th appt cancelled due to inclement weather. New appts scheduled on Feb 3rd.
